# Patient Record
Sex: Male | Born: 1965
Health system: Southern US, Community
[De-identification: ages and names within clinical notes are randomized; demographics above are authoritative.]

## PROBLEM LIST (undated history)

## (undated) DIAGNOSIS — N200 Calculus of kidney: Secondary | ICD-10-CM

## (undated) DIAGNOSIS — K5792 Diverticulitis of intestine, part unspecified, without perforation or abscess without bleeding: Secondary | ICD-10-CM

## (undated) DIAGNOSIS — I10 Essential (primary) hypertension: Secondary | ICD-10-CM

## (undated) HISTORY — PX: ABDOMINAL SURGERY: SHX537

---

## 1989-06-10 HISTORY — PX: CARPAL TUNNEL RELEASE: SHX101

## 1991-06-11 HISTORY — PX: ORIF TIBIA & FIBULA FRACTURES: SHX2131

## 1991-06-11 HISTORY — PX: OTHER SURGICAL HISTORY: SHX169

## 2002-06-10 HISTORY — PX: COLOSTOMY: SHX63

## 2003-05-23 ENCOUNTER — Inpatient Hospital Stay (HOSPITAL_COMMUNITY): Admission: EM | Admit: 2003-05-23 | Discharge: 2003-06-03 | Payer: Self-pay | Admitting: Emergency Medicine

## 2003-05-27 ENCOUNTER — Encounter (INDEPENDENT_AMBULATORY_CARE_PROVIDER_SITE_OTHER): Payer: Self-pay | Admitting: Specialist

## 2003-06-11 HISTORY — PX: APPENDECTOMY: SHX54

## 2003-07-01 ENCOUNTER — Encounter: Admission: RE | Admit: 2003-07-01 | Discharge: 2003-07-01 | Payer: Self-pay | Admitting: General Surgery

## 2003-08-18 ENCOUNTER — Encounter: Admission: RE | Admit: 2003-08-18 | Discharge: 2003-08-18 | Payer: Self-pay | Admitting: General Surgery

## 2003-09-09 ENCOUNTER — Encounter: Admission: RE | Admit: 2003-09-09 | Discharge: 2003-09-20 | Payer: Self-pay | Admitting: General Surgery

## 2003-09-15 ENCOUNTER — Inpatient Hospital Stay (HOSPITAL_COMMUNITY): Admission: RE | Admit: 2003-09-15 | Discharge: 2003-09-20 | Payer: Self-pay | Admitting: General Surgery

## 2003-09-15 ENCOUNTER — Encounter (INDEPENDENT_AMBULATORY_CARE_PROVIDER_SITE_OTHER): Payer: Self-pay | Admitting: Specialist

## 2004-04-25 ENCOUNTER — Observation Stay (HOSPITAL_COMMUNITY): Admission: EM | Admit: 2004-04-25 | Discharge: 2004-04-25 | Payer: Self-pay | Admitting: Emergency Medicine

## 2004-06-10 HISTORY — PX: HERNIA REPAIR: SHX51

## 2004-07-06 IMAGING — CT CT ABDOMEN W/ CM
1 series · 15 of 32 positions shown, 19 images · IV contrast (GASTRO & OMNIPAQUE 150)
Comparison: none

CLINICAL DATA: Previous diverticulitis with sigmoid resection and colostomy placement.  Abdominal bloating.

[Series 2: routine abdomen · axial · 0.86mm/px · z∈[-360,+30]mm · 15 of 145 slices shown, 19 images]
[im 10/145  soft-tissue]
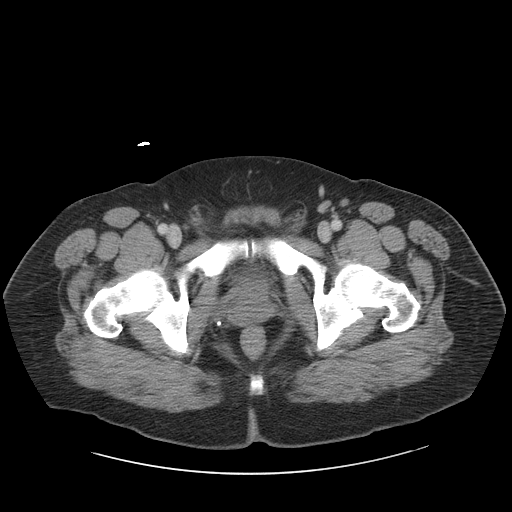
[im 10/145  bone]
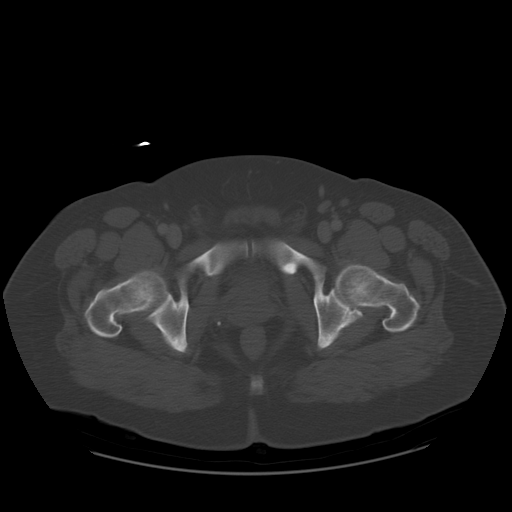
[im 19/145  soft-tissue]
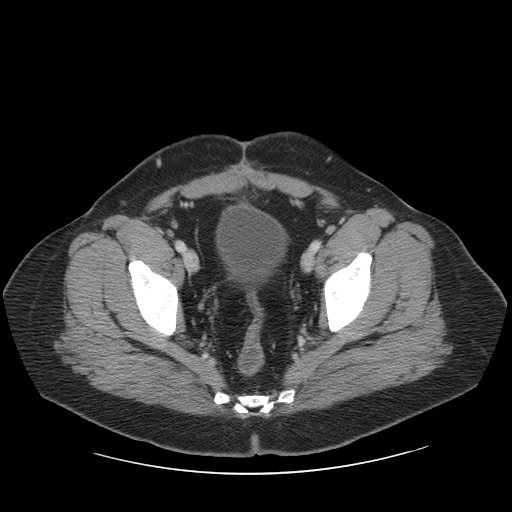
[im 28/145  soft-tissue]
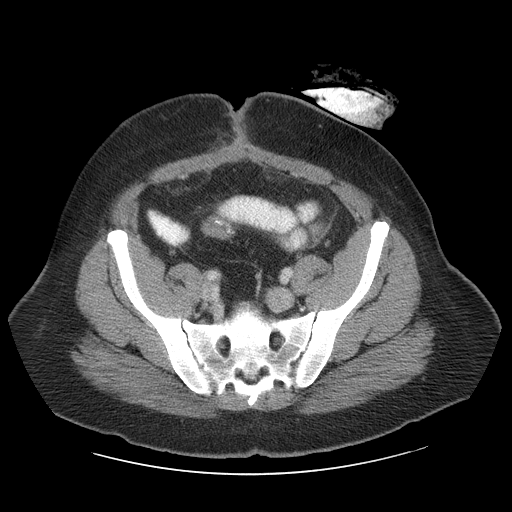
[im 42/145  soft-tissue]
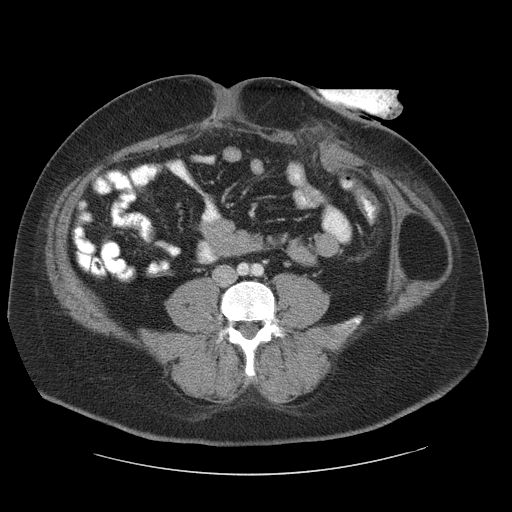
[im 52/145  soft-tissue]
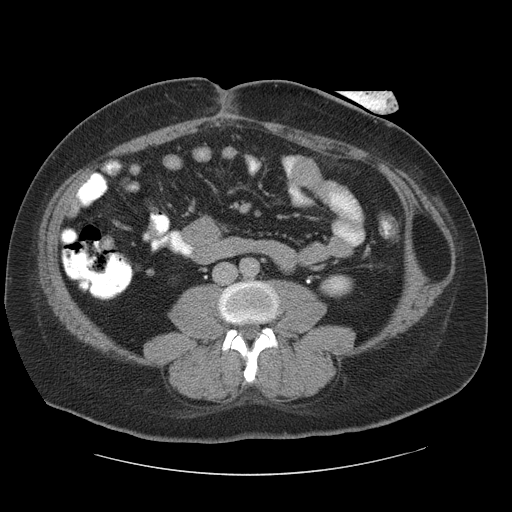
[im 61/145  soft-tissue]
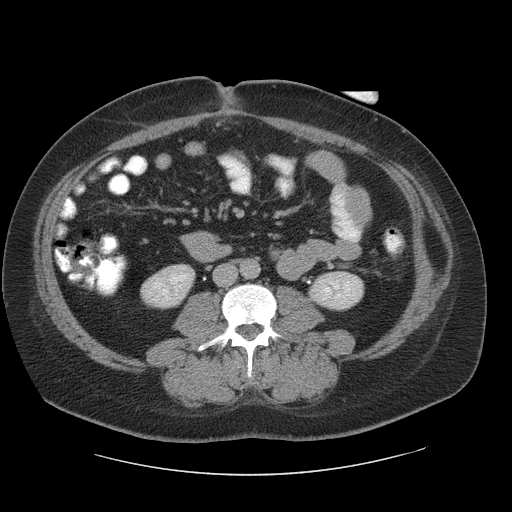
[im 75/145  soft-tissue]
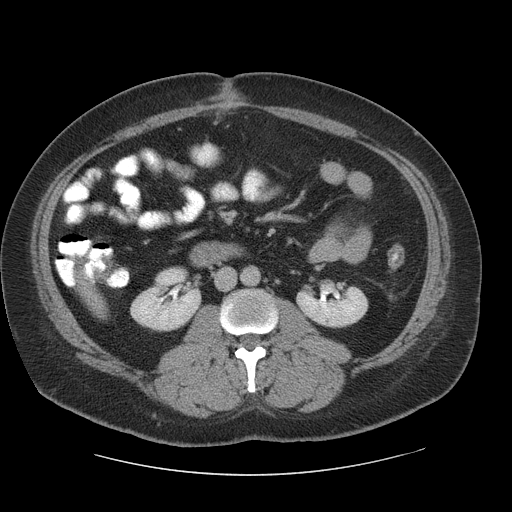
[im 84/145  soft-tissue]
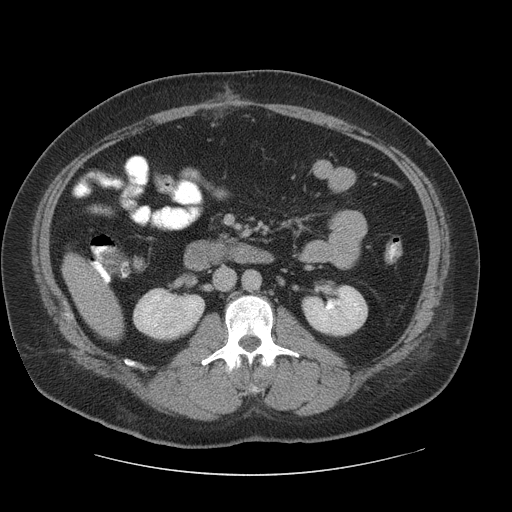
[im 93/145  soft-tissue]
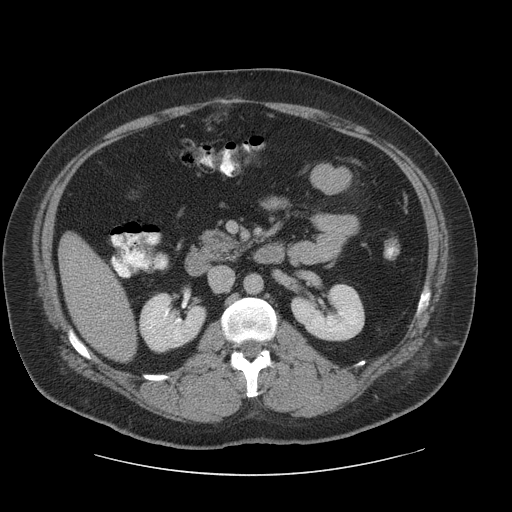
[im 93/145  bone]
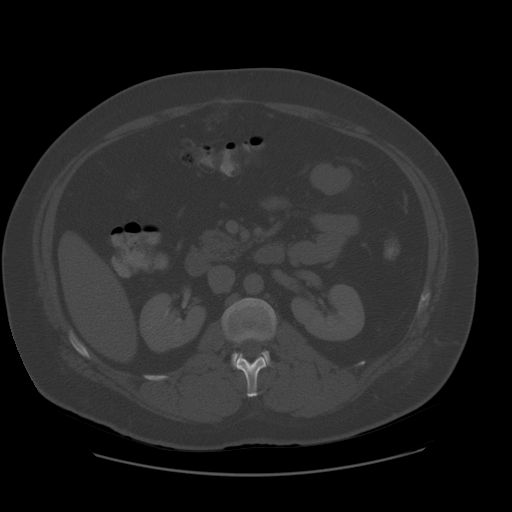
[im 103/145  soft-tissue]
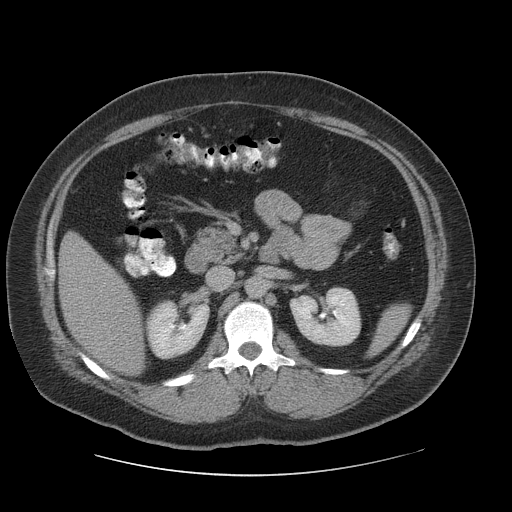
[im 117/145  soft-tissue]
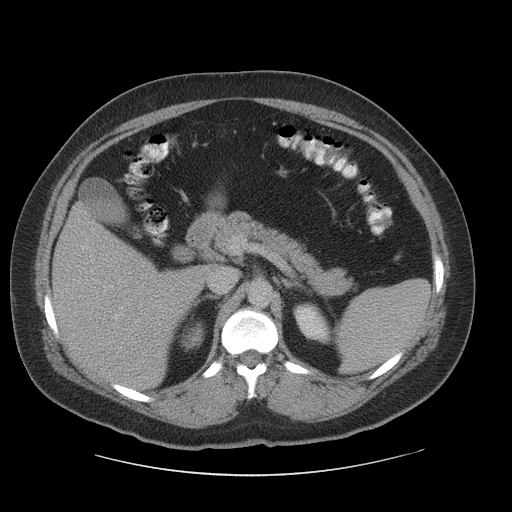
[im 126/145  soft-tissue]
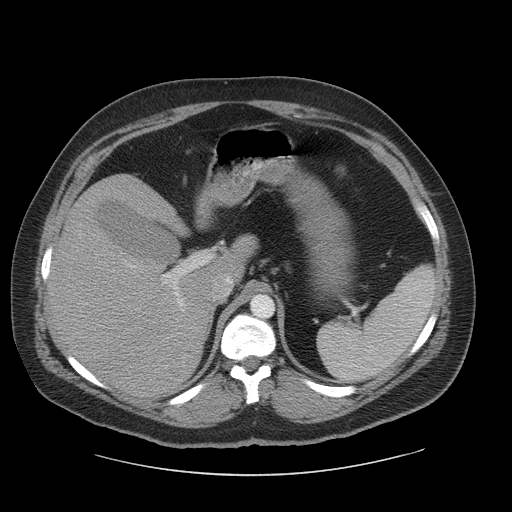
[im 126/145  lung]
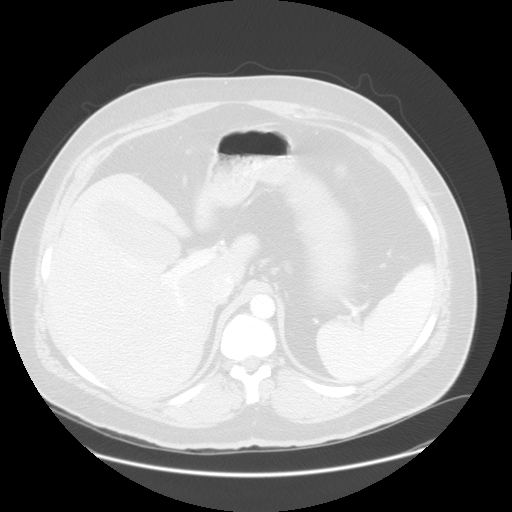
[im 131/145  lung]
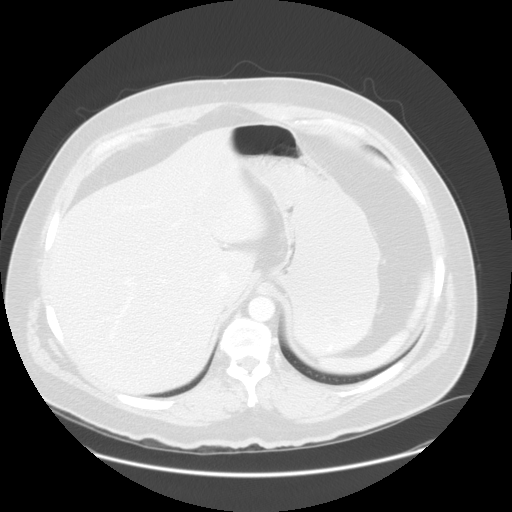
[im 135/145  soft-tissue]
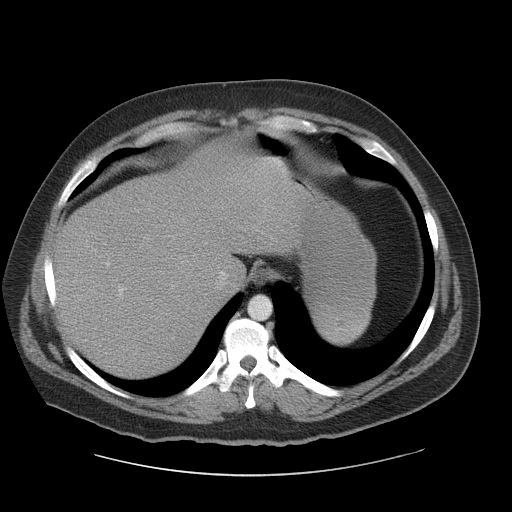
[im 135/145  lung]
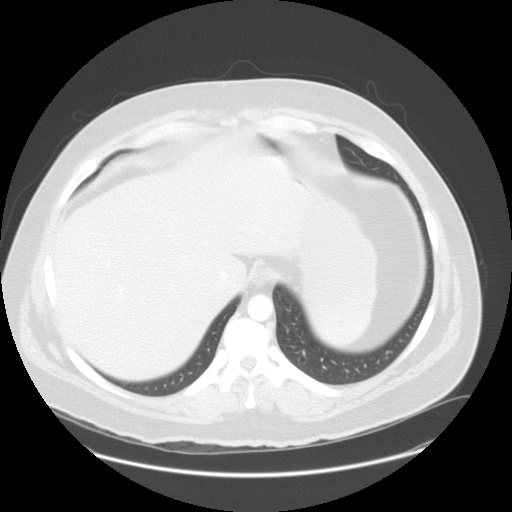
[im 140/145  lung]
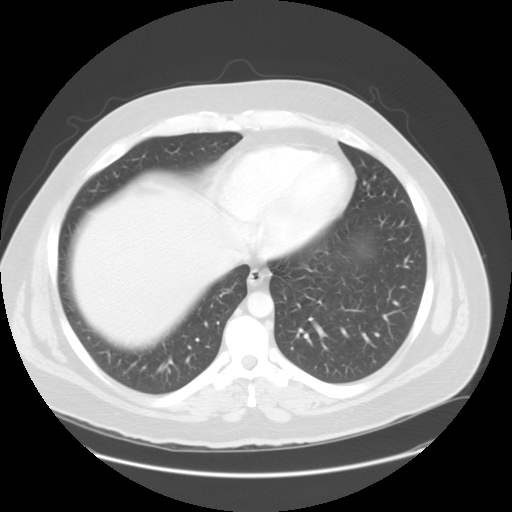

[15 of 32 positions shown; findings below may reference images not displayed]

CT OF THE ABDOMEN WITH IV CONTRAST
 Multidetector helical study performed during IV contrast enhancement with 150 cc of Omnipaque injected at 2.7 cc per second.  Comparison is made with the prior study from [HOSPITAL] dated 05/27/03.  Scans of the lung bases are normal.  The liver, spleen, pancreas, kidneys, and adrenal glands have a normal appearance.  There is no free peritoneal fluid or air.  

 IMPRESSION
 Negative CT of the abdomen.

 CT OF THE PELVIS WITH IV CONTRAST
 When compared to the previous study, the patient has undergone sigmoid resection with creation of colostomy in the left lower quadrant.  There is no pelvic abscess or free peritoneal fluid/air.  Mild residual inflammatory changes are seen within the fat surrounding the descending colon and there is slight residual bowel wall thickening associated with the distal descending colon.  Also, there is significant interval improvement in the previously seen small bowel distention.  Contrast is seen entering the colon and the colostomy bag.  There is an incidental lipoma seen within the lateral abdominal wall musculature on the left.  The bladder has a normal appearance. 

 IMPRESSION
 Interval sigmoid resection for diverticulitis with creation of colostomy.  No pelvic abscess.  Mild residual inflammatory changes seen within the pericolonic fat in the region of the distal descending colon.  

 Incidental lipoma associated with the lateral abdominal wall musculature. 

 [REDACTED]

## 2004-07-20 ENCOUNTER — Ambulatory Visit (HOSPITAL_COMMUNITY): Admission: RE | Admit: 2004-07-20 | Discharge: 2004-07-20 | Payer: Self-pay | Admitting: General Surgery

## 2004-08-17 ENCOUNTER — Inpatient Hospital Stay (HOSPITAL_COMMUNITY): Admission: RE | Admit: 2004-08-17 | Discharge: 2004-08-18 | Payer: Self-pay | Admitting: General Surgery

## 2006-06-25 ENCOUNTER — Encounter: Admission: RE | Admit: 2006-06-25 | Discharge: 2006-06-25 | Payer: Self-pay | Admitting: Internal Medicine

## 2006-06-29 ENCOUNTER — Emergency Department (HOSPITAL_COMMUNITY): Admission: EM | Admit: 2006-06-29 | Discharge: 2006-06-29 | Payer: Self-pay | Admitting: Emergency Medicine

## 2007-06-11 HISTORY — PX: ANKLE FRACTURE SURGERY: SHX122

## 2008-06-01 ENCOUNTER — Ambulatory Visit (HOSPITAL_COMMUNITY): Admission: RE | Admit: 2008-06-01 | Discharge: 2008-06-01 | Payer: Self-pay | Admitting: Orthopedic Surgery

## 2009-06-10 HISTORY — PX: KIDNEY STONE SURGERY: SHX686

## 2009-11-13 ENCOUNTER — Ambulatory Visit (HOSPITAL_COMMUNITY): Admission: RE | Admit: 2009-11-13 | Discharge: 2009-11-14 | Payer: Self-pay | Admitting: Urology

## 2009-11-24 ENCOUNTER — Ambulatory Visit (HOSPITAL_COMMUNITY): Admission: RE | Admit: 2009-11-24 | Discharge: 2009-11-24 | Payer: Self-pay | Admitting: Urology

## 2010-08-26 LAB — SURGICAL PCR SCREEN
MRSA, PCR: NEGATIVE
Staphylococcus aureus: NEGATIVE

## 2010-08-27 LAB — BASIC METABOLIC PANEL
BUN: 16 mg/dL (ref 6–23)
CO2: 28 mEq/L (ref 19–32)
Calcium: 9.6 mg/dL (ref 8.4–10.5)
Chloride: 105 mEq/L (ref 96–112)
Creatinine, Ser: 1.1 mg/dL (ref 0.4–1.5)
GFR calc Af Amer: >60 mL/min (ref >60)
GFR calc non Af Amer: >60 mL/min (ref >60)
Glucose, Bld: 94 mg/dL (ref 70–99)
Potassium: 4.3 mEq/L (ref 3.5–5.1)
Sodium: 140 mEq/L (ref 135–145)

## 2010-08-27 LAB — CBC
HCT: 43.9 % (ref 39.0–52.0)
Hemoglobin: 15.4 g/dL (ref 13.0–17.0)
MCHC: 35.1 g/dL (ref 30.0–36.0)
MCV: 92.5 fL (ref 78.0–100.0)
Platelets: 182 10*3/uL (ref 150–400)
RBC: 4.75 MIL/uL (ref 4.22–5.81)
RDW: 12.9 % (ref 11.5–15.5)
WBC: 6.3 10*3/uL (ref 4.0–10.5)

## 2010-08-27 LAB — HEMOGLOBIN AND HEMATOCRIT, BLOOD
HCT: 34.1 % — ABNORMAL LOW (ref 39.0–52.0)
HCT: 39.4 % (ref 39.0–52.0)
Hemoglobin: 12 g/dL — ABNORMAL LOW (ref 13.0–17.0)
Hemoglobin: 13.9 g/dL (ref 13.0–17.0)

## 2010-10-23 NOTE — Op Note (Signed)
NAME:  Christopher Oliver, Christopher Oliver                 ACCOUNT NO.:  [id number]   MEDICAL RECORD NO.:  [id number]          PATIENT TYPE:  AMB   LOCATION:  SDS                          FACILITY:  [facility]   PHYSICIAN:  Rodney A. Mortenson, M.D.DATE OF BIRTH:  May 24, 1966   DATE OF PROCEDURE:  06/01/2008  DATE OF DISCHARGE:                               OPERATIVE REPORT   PREOPERATIVE DIAGNOSIS:  Fracture dislocation right ankle oblique  fracture, right distal fibula.   POSTOPERATIVE DIAGNOSIS:  Fracture dislocation right ankle oblique  fracture, right distal fibula.   OPERATION:  Open reduction and internal fixation using an 8-hole side  plate on the fibula and a lasso through the lateral plate and tibia.   SURGEON:  Lenard Galloway. Chaney Malling, MD   ASSISTANT:  Oris Drone. Petrarca, PA-C   ANESTHESIA:  General.   PROCEDURE:  The patient was placed on the operating table in the supine  position with a pneumatic tourniquet above the right upper thigh.  The  right lower extremity was prepped with DuraPrep and draped out in the  usual manner.  The leg was then wrapped out with an Esmarch and  tourniquet was elevated.  An incision was made over the lateral side of  the fibula starting above the fracture and carried down to the tip of  the fibula.  Skin edges were retracted.  Self-retaining retractor was  put in place.  Care was taken to avoid injury to the superficial  cutaneous nerves.  The fracture could clearly be seen.  The talus was  reduced back in the mortise and a bone clamp was placed on the fracture  and held in a reduced position.  There appeared to be an anatomic  reduction.  X-rays were taken and good positioning of the fracture and  reduction of the talus in the ankle was achieved.  An 8-hole side plate  was selected and bent and preformed to fit the lateral contour of the  fibula.  This was held on the flue of the second clamp.  X-ray was taken  and good position was seen.  Drill holes were placed  distally and 2  cancellous screws were placed in the distal plate proximally and a cork  screw was placed across the fibula and further x-rays were taken.  Excellent positioning was achieved.  Further screws were then measured  and the appropriate length screws were inserted in a standard manner.  Just above the level of the distal tibial articular surface, drill hole  was placed through the fibula and through the tibia to the medial border  of the tibia using a guide wire plus a cannulated drill.  The lasso was  then selected and the pass needle was passed through the fibula across  the tibia to medial border of the tibia and brought out through the skin  through a puncture wound.  The lasso was then pulled through the fibular  hole, through the hole in the tibia and the button medially was rotated  90 degrees and traction was placed on the large FiberWire sutures and  good purchase medially  was achieved.  The button was flushed on the  tibia.  Pressure was placed on the fibula, closed down the syndesmosis,  and the large FiberWire sutures were pulled on quite tightly and knots  were tied.  Excellent reduction was achieved according to the x-rays.  The button was on the top of the lateral side plate, which had a  stability.  The excess suture was cut.  Wound was irrigated with copious  amounts saline solution.  Subcutaneous tissue was closed with Vicryl and  stainless steel staples were used to close the skin.  Sterile dressing  was applied.  The patient returned to the recovery room in excellent  condition.  Technically, an excellent reduction was achieved.   DRAINS:  None.   COMPLICATIONS:  None.   DISPOSITION:  1. Usual postop instructions.  2. Percocet for pain.  3. To my office on Monday and Wednesday to seen Jacqualine Code, PA-C.      Rodney A. Chaney Malling, M.D.  Electronically Signed     RAM/MEDQ  D:  06/01/2008  T:  06/02/2008  Job:  [id number]

## 2010-10-26 NOTE — Op Note (Signed)
NAME:  Christopher Oliver, Christopher Oliver                           ACCOUNT NO.:  [id number]   MEDICAL RECORD NO.:  [id number]                   PATIENT TYPE:  INP   LOCATION:  0463                                 FACILITY:  [facility]   PHYSICIAN:  Ollen Gross. Vernell Morgans, M.D.              DATE OF BIRTH:  [birth year]   DATE OF PROCEDURE:  09/15/2003  DATE OF DISCHARGE:                                 OPERATIVE REPORT   PREOPERATIVE DIAGNOSIS:  Prior colostomy for perforated diverticulitis.   POSTOPERATIVE DIAGNOSIS:  Prior colostomy for perforated diverticulitis.   OPERATION/PROCEDURE:  1. Exploratory laparotomy.  2. Lysis of adhesions.  3. Appendectomy.  4. Colostomy take-down.  5. Repair of ventral hernia.   SURGEON:  Ollen Gross. Carolynne Edouard, M.D.   ASSISTANT:  Abigail Miyamoto, M.D.   ANESTHESIA:  General endotracheal anesthesia.   DESCRIPTION OF PROCEDURE:  After informed consent was obtained, the patient  was brought to the operating room and placed in the supine position on the  operating room table.  After adequate induction of general anesthesia, the  patient was placed in the lithotomy position and his abdomen and perineum  were prepped with Betadine and draped in the usual sterile manner.   A midline incision was as made with a 10-blade knife.  His old midline scar  was also excised sharply with the 15-blade knife and the electrocautery.  The dissection was then carried through the subcutaneous tissue sharply with  the electrocautery until the midline of the anterior abdominal wall fascia  was encountered.  The prior 0 Prolene stitches were encountered during this  portion of the dissection.  These were removed sharply with heavy scissors.  The midline fascia was incised with the electrocautery and then blunt  dissection was then carried out below this area to free the adhesions from  the anterior abdominal wall.  There was significant omentum adherent to the  anterior abdominal wall.  This was taken down  by a combination of blunt  finger dissection and sharp dissection with the electrocautery and  Metzenbaum scissors.  Laterally free space was able to be identified and  once this was visible, the omentum was taken down off the anterior abdominal  wall sharply with the electrocautery, avoiding any of the bowel.  Once this  was finished, the omentum was able to be reflected superiorly.  There were  multiple adhesions between loops of small bowel and these were all taken  down sharply with the Metzenbaum scissors.  The small bowel was then able to  be packed into the upper abdomen.  The patient was placed in the  Trendelenburg position to aid with this.  A Balfour retractor was used to  retract the sidewalls of the abdomen laterally.   Initially attention was turned to the right lower quadrant.  The cecum and  appendix were identified.  The mesoappendix was taken down by serially  clamping, dividing  and ligating with Kelly clamps and 2-0 silk ties.  Once  the appendix was free at its space, the base of the appendix was clamped  with a straight hemostat.  Hemostat was then moved several millimeters  distal and reclamped.  An 0 chromic tie was placed across the base of the  appendix at a clamped portion.  The appendix was then divided just proximal  to the clamp sharply with the 10-blade knife.  The stump of the appendix was  then dunked using the 2-0 silk Z stitch.   Next, the stump of the rectum was identified.  Prior 2-0 Prolene stitches  had been used to mark the end of the rectal stump.  The rectal stump was  mobilized somewhat by incising the peritoneal reflection down in the pelvis  near the base of the mesentery of the rectum.  The distal couple centimeters  of the rectal stump was then mobilized by taking down the mesentery to the  small section of the rectum with Kelly clamps, dividing with Metzenbaum  scissors and ligating with 2-0 silk ties.  This dissection was carried to  the  edge of the bowel wall at the distal couple of centimeters of rectum.  An Allen clamp was then placed across the rectal wall at this point and the  distal segment was excised sharply with the 10-blade knife.  Rectal wall  appeared to be very healthy and had good blood supply.   Next, attention was then turned to the ostomy.  The ostomy was mobilized  initially from the inside sharply with the electrocautery, and once it had  been mobilized fairly well from the inside, attention was turned to the skin  level ostomy.  The ostomy was excised at the skin in an elliptical fashion  using a 10-blade knife.  The dissection was carried through the skin and  subcutaneous tissue sharply with the electrocautery until the colostomy was  mostly freed.  It was then able to be brought through the abdominal wall  inside the abdomen and the rest of the ostomy was mobilized sharply with the  electrocautery from the rest of the subcutaneous and muscular tissue.  It  was noted that the ostomy opening through the anterior abdominal wall had  enlarged somewhat and there seemed to be some element of hernia to this  which was taken down.  The left colon was then mobilized by incising its  retroperitoneal attachment along the [place] line of Toldt to give it some  mobilization.  The distal 5-6 cm of the proximal segment was then excised by  initially dividing the mesentery to this portion of the bowel by serially  clamping with the Kelly clamps, dividing, and ligating with 2-0 silk ties.  This dissection was carried to the colon wall.  The colon at this point of  division was then clamped with both an Allen clamp and a Kocher clamp and  divided between the two.  The distal segment was sent to pathology.  The  proximal segment was very healthy-appearing with good blood supply and  easily would reach the rectal stump.  An anastomosis between the proximal and distal segments was then accomplished using interrupted 3-0  silk full-  thickness stitches.  Initially the posterior wall was sewn in this manner  with the knots on the inside and then the anterior wall of the anastomosis  was completed in the similar fashion.  The last several stitches were sewn  in a simple fashion with  the knots on the outside.  Once this was  accomplished, the anastomosis appeared to be in good condition and widely  patent.   The abdomen was then irrigated with copious amounts of the saline and gloves  were changed.  The defect of the anterior abdominal wall at the old  colostomy site was then closed, initially with some 0 Prolene interrupted  stitches and then with a #1 Prolene running stitch from the inside.  The  defect was then closed in a second layer from the outside with interrupted 0  Prolene stitches.  The scar tissue from the midline of the anterior  abdominal wall was very thick at the fascial level and it was difficult to  identify the fascia within the scar.  Because of this, the subcutaneous  tissue just anterior to the fascia was undermined sharply with the  electrocautery to allow a good layer of fascia and scar to put back  together.  The anterior abdominal wall was then closed with three running #1  PDS sutures.  The upper portion of the wound was also reinforced with some 0  Prolene interrupted stitches.  Once the anterior abdominal wall was closed  and the subcutaneous tissue of the midline wound and the ostomy site  irrigated with a liter of saline mixed with several mL of Betadine, the skin  incisions were then all closed with staples.  The patient tolerated the  procedure well.  At the end of the case, all needle, sponge and instrument  counts were correct.  The patient was then awakened and taken to the  recovery room in stable condition.                                               Ollen Gross. Vernell Morgans, M.D.    PST/MEDQ  D:  09/15/2003  T:  09/16/2003  Job:  [id number]

## 2010-10-26 NOTE — Discharge Summary (Signed)
NAME:  Christopher Oliver, Christopher Oliver                           ACCOUNT NO.:  [id number]   MEDICAL RECORD NO.:  [id number]                   PATIENT TYPE:  INP   LOCATION:  0463                                 FACILITY:  [facility]   PHYSICIAN:  Ollen Gross. Vernell Morgans, M.D.              DATE OF BIRTH:  [birth year]   DATE OF ADMISSION:  09/15/2003  DATE OF DISCHARGE:  09/20/2003                                 DISCHARGE SUMMARY   HISTORY OF PRESENT ILLNESS:  Christopher Oliver is a 45 year old [place] male who had  had a prior colostomy for perforated sigmoid diverticulitis.   HOSPITAL COURSE:  He was brought to the operating room on September 15, 2003 for  colostomy take down.  He was also on the __________ study.  He tolerated the  operation well.  On postoperative day #1, he was offered some liquids per  protocol.  He did have some itching, probably secondary to his morphine.  He  was monitored closely, and ran some low-grade fevers over the next couple  days.  He did have some abdominal distention after surgery, but by September 20, 2003, he was passing flatus and began having bowel movements.  He had  tolerated his diet, and at that point was ready for discharge home.   MEDICATIONS AT THE TIME OF DISCHARGE:  1. Mepergan Fortis for pain.  2. Dramamine for nausea.   ACTIVITY:  No heavy lifting.   DIET:  As tolerated.   CONDITION:  Stable.   FINAL DIAGNOSIS:  Colostomy take down.   FOLLOW UP:  With Dr. Carolynne Edouard within the week for staple removal.   DISPOSITION:  He was discharged home.                                               Ollen Gross. Vernell Morgans, M.D.    PST/MEDQ  D:  10/05/2003  T:  10/05/2003  Job:  [id number]

## 2010-10-26 NOTE — Op Note (Signed)
NAME:  Christopher Oliver, Christopher Oliver                           ACCOUNT NO.:  [id number]   MEDICAL RECORD NO.:  [id number]                   PATIENT TYPE:  INP   LOCATION:  5526                                 FACILITY:  [facility]   PHYSICIAN:  Ollen Gross. Vernell Morgans, M.D.              DATE OF BIRTH:  Apr 21, 1966   DATE OF PROCEDURE:  05/27/2003  DATE OF DISCHARGE:  06/03/2003                                 OPERATIVE REPORT   PREOPERATIVE DIAGNOSIS:  Perforated sigmoid diverticulitis.   POSTOPERATIVE DIAGNOSIS:  Perforated sigmoid diverticulitis.   PROCEDURE:  Sigmoid colectomy with colostomy.   SURGEON:  Ollen Gross. Carolynne Edouard, M.D.   ASSISTANT:  __________.   ANESTHESIA:  General endotracheal.   DESCRIPTION OF PROCEDURE:  After informed consent was obtained, the patient  was brought to the operating room and placed in supine position on the  operating table.  After adequate induction of general anesthesia, the  patient's abdomen was prepped with Betadine and draped in the usual sterile  manner.  A midline incision was made with a 10 blade knife.  This incision  was carried down through the skin and subcutaneous tissue sharply with the  electrocautery until the linea alba was identified.  The linea alba was  incised with the electrocautery.  The preperitoneal space was then probed  bluntly with a hemostat until the peritoneum was identified.  The peritoneum  was opened with a pair of Metzenbaum scissors, gaining access to the  abdominal cavity.  The rest of the incision was then opened under direct  vision.  A Balfour retractor was used to retract the sidewalls of the  abdomen laterally.  There was a significant amount of infection identified.  This was cultured.  The attention was turned to the left lower quadrant.  The sigmoid colon was mobilized by incising its attachment to the  retroperitoneum along the Jaffer line of Toldt.  A site was chosen above the  area of inflammation of the colon for division of the  colon wall.  The  mesentery at this point was opened sharply with the electrocautery.  A GIA-  75 stapler was then placed across the bowel at this point, clamped and  fired, thereby dividing the bowel between staple lines.  The mesentery of  the sigmoid colon was then taken down by serially clamping with Kelly  clamps, dividing, and ligating with 2-0 silk ties each of the vessels within  the mesentery.  This dissection was kept within the mesentery to stay above  the ureter.  Once the dissection was carried down to a point below the area  of inflammation of the sigmoid colon, another site was chosen for division  of the rectosigmoid at an area where the bowel appeared to be healthy.  The  mesentery at this point was opened sharply with electrocautery.  A GIA-75  stapler was again placed across the bowel, at this point  clamped and fired,  thereby dividing the bowel between staple lines.  The rest of the mesentery  was taken down with Kelly clamps by clamping, dividing, and ligating the  rest of the vessels within the mesentery of the bowel that was affected.  Once this was accomplished the specimen was removed from the patient, sent  to pathology for further evaluation.  The abdomen was then irrigated with  copious amounts of saline.  The descending colon was then mobilized by  incising its retroperitoneal attachment along the [place] line of Toldt to a  point near the splenic flexure.  This allowed enough mobilization of the  descending colon to be able to bring it out as a diverting colostomy.  A  site was then chosen on the abdominal wall for the colostomy site and a  circular area of skin and subcutaneous fat was removed sharply with the 10  blade knife and electrocautery.  The rectus fascia was opened in a cruciate  manner to allow three fingers to be inserted easily.  The descending colon  was then brought up through this opening to the skin.  Because of the  concern for the viability of  the colostomy, this was opened first.  The  mucosa was somewhat dusky-appearing, and it appeared as though it was being  pinched as it came through the abdominal wall.  The cruciate opening of the  rectus fascia was then opened more, which took most of the tension away from  the colostomy.  The colostomy was then sewn circumferentially with  interrupted 3-0 Vicryls to the skin to mature the colostomy.  Again the rest  of the abdomen was then irrigated with copious amounts of saline.  The  fascia of the anterior abdominal wall was closed with two running #1 PDS  sutures.  The subcutaneous tissue was irrigated with copious amounts of  saline, and the skin was closed with staples.  Sterile dressings and a  colostomy appliance were applied.  The patient tolerated the procedure well.  Prior to closing the abdomen, also a 43 [place] round Blake drain was brought  into the right lower quadrant and placed down in the pelvis and the rectal  stump was marked with a 2-0 Prolene stitch.  The patient tolerated the  procedure well.  At the end of the case all needle, sponge, and instrument  counts were correct.  The patient was then awakened and taken to the  recovery room in stable condition.                                               Ollen Gross. Vernell Morgans, M.D.    PST/MEDQ  D:  06/06/2003  T:  06/06/2003  Job:  [id number]

## 2010-10-26 NOTE — Op Note (Signed)
NAME:  Christopher Oliver, Christopher Oliver                 ACCOUNT NO.:  [id number]   MEDICAL RECORD NO.:  [id number]          PATIENT TYPE:  INP   LOCATION:  0457                         FACILITY:  [facility]   PHYSICIAN:  Ollen Gross. Vernell Morgans, M.D. DATE OF BIRTH:  [birth year]   DATE OF PROCEDURE:  08/16/2004  DATE OF DISCHARGE:  08/18/2004                                 OPERATIVE REPORT   PREOPERATIVE DIAGNOSES:  Incisional ventral hernia.   POSTOPERATIVE DIAGNOSES:  Incisional ventral hernia.   PROCEDURE:  Laparoscopic ventral hernia repair with mesh and extensive lysis  of adhesions.   SURGEON:  Ollen Gross. Carolynne Edouard, M.D.   ASSISTANT:  Gabrielle Dare. Janee Morn, M.D.   ANESTHESIA:  General endotracheal.   DESCRIPTION OF PROCEDURE:  After informed consent was obtained, the patient  was brought to the operating room, placed in a supine position on the  operating room table.  After adequate induction of general anesthesia, the  patient's abdomen was prepped with Betadine, draped in the usual sterile  manner including an Ioban in the right lateral abdomen. A small incision was  made with a 15 blade knife. A camera and Optiview type port were used to  dissect gently and bluntly through the layers of the abdominal wall until  the abdominal cavity was entered.  The abdomen was then insufflated with  carbon dioxide without difficulty. The laparoscope was then placed through  the Optiview 10 mm port and the abdomen was inspected. There were a  significant number of adhesions of what appeared to be just omentum to the  anterior abdominal wall. A site was chosen in the right upper quadrant for  placement of a 5 mm port, this area was infiltrated with 0.25% Marcaine, a  small incision was made with a 15 blade knife and a 5 mm port was placed  bluntly through this incision into the abdominal cavity under direct vision.  Using the harmonic scalpel and blunt dissection, the adhesions were  gradually taken down. Some of these adhesions  were also taken down sharply  with the laparoscopic scissors. This careful dissection of these adhesions  took over two hours to take down safely.  Once all of these adhesions had  been taken down from the anterior abdominal wall, it did appear that there  were several Swiss cheese type defects to the anterior abdominal wall  incision and two other smaller defects at the old ostomy site.  The size of  the mesh that would be needed was estimated using a spinal needle allowing  for some overlap of the mesh onto healthy tissue and the size of the mesh  chosen was approximately 20 x 20 cm. A piece of Parietex mesh was chosen for  this and it was cut to fit. The mesh was oriented with letters and #1  novofil stitches were placed on the mesh in 8 places concentrically around  the circle edge of the mesh.  The mesh was then dampened with some saline,  rolled like a cigar and placed through the 10 mm port into the abdomen. At  this point, another  5 mm port was placed on the left abdomen in a similar  manner by infiltrating the area with 0.25% Marcaine making a small stab  incision with a 15 blade knife and placing the 5 mm port bluntly through  this incision under direct vision into the abdominal cavity. The mesh was  then unrolled and reoriented to correspond to the orientation on the  abdomen. Eight stab incisions were made around the outer aspect of the  estimated size of the mesh that corresponded to the placement of the novofil  stitches. A suture passing device was then placed through each of these  incisions in order to under direct vision grab the tails of the  corresponding novofil stitch and bring those back through the abdominal  wall. Once this had been done for all eight positions, the novofil stitches  were tightened up and the mesh appeared to approximate the anterior  abdominal wall very nicely and cover the defects completely. Each of these  stitches was then tied down and cut.   Next, a protacking device was used to  place tacks circumferentially around the edge of the mesh into the abdominal  wall to make sure there were no gaps and to make sure the mesh nicely  approximated the anterior abdominal wall.  Once this was accomplished, the  mesh appeared to be in good position covering all of the abdominal wall  defects. The abdomen was inspected again and everything appeared to be  hemostatic. At this point, the ports were removed under direct vision and  the gas was allowed to escape. The mesh was visualized as the gas was  leaving to continue to nicely approximate the anterior abdominal wall. All  of these skin incisions were then closed with interrupted 4-0 Monocryl  subcuticular stitches. Benzoin, Steri-Strips, sterile dressings and  abdominal binder were then applied to the abdomen.  The patient tolerated  the procedure well. At the end of the case, all sponge, needle and  instrument counts were correct.  The patient was then awakened and taken to  the recovery room in stable condition.      PST/MEDQ  D:  08/19/2004  T:  08/20/2004  Job:  [id number]

## 2010-10-26 NOTE — Discharge Summary (Signed)
NAME:  Deguire, JADRIEN NARINE                           ACCOUNT NO.:  [id number]   MEDICAL RECORD NO.:  [id number]                   PATIENT TYPE:  INP   LOCATION:  5526                                 FACILITY:  [facility]   PHYSICIAN:  Ollen Gross. Vernell Morgans, M.D.              DATE OF BIRTH:  1966/03/22   DATE OF ADMISSION:  05/23/2003  DATE OF DISCHARGE:  06/02/2003                                 DISCHARGE SUMMARY   HOSPITAL COURSE:  Mr. Welles is a 45 year old [place] male who was initially  admitted with sigmoid diverticulitis with some evidence of small contained  perforation.  He was started on broad-spectrum antibiotics and close  monitoring.  He initially did well but then failed to improve and continued  to have pain and fevers.  He then underwent repeat CT on May 26, 2004  that showed more evidence of perforation and abscess.  He was then taken to  the operating room on the 17th and underwent a sigmoid colectomy and  colostomy.  His postoperative course was complicated by some mild ileus that  persisted for several days but gradually improved.  He was maintained on  Cipro and Flagyl postoperatively.  On May 31, 2003, his ileus began to  improve and his nasogastric tube was able to be discontinued.  He slowly  advanced and improved until on the 23rd, he was ready for discharge home.   MEDICATIONS:  His medications at time of discharge include Cipro, Flagyl and  Vicodin for pain.   DIET:  His diet is a low-residue diet.   CONDITION ON DISCHARGE:  Condition is stable.   FINAL DIAGNOSIS:  Perforated sigmoid diverticulitis.   WOUND CARE:  He is to get wound dressing changes and ostomy care at home by  the home health nurses.   FOLLOWUP:  He will follow up in 1 week with Dr. Ollen Gross. Carolynne Edouard.   DISPOSITION:  He is discharged home.                                                Ollen Gross. Vernell Morgans, M.D.    PST/MEDQ  D:  06/27/2003  T:  06/27/2003  Job:  [id number]
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
## 2011-03-15 LAB — BASIC METABOLIC PANEL
Calcium: 9.4 mg/dL (ref 8.4–10.5)
Creatinine, Ser: 1.12 mg/dL (ref 0.4–1.5)
GFR calc Af Amer: >60 mL/min (ref >60)
GFR calc non Af Amer: >60 mL/min (ref >60)
Sodium: 138 mEq/L (ref 135–145)

## 2011-03-15 LAB — CBC
Hemoglobin: 16.2 g/dL (ref 13.0–17.0)
MCHC: 34.3 g/dL (ref 30.0–36.0)
RBC: 5.17 MIL/uL (ref 4.22–5.81)

## 2015-01-15 ENCOUNTER — Emergency Department (HOSPITAL_COMMUNITY)
Admission: EM | Admit: 2015-01-15 | Discharge: 2015-01-15 | Disposition: A | Discharge status: Home / Self Care | Payer: BLUE CROSS/BLUE SHIELD | Attending: Emergency Medicine | Admitting: Emergency Medicine

## 2015-01-15 ENCOUNTER — Emergency Department (HOSPITAL_COMMUNITY): Payer: BLUE CROSS/BLUE SHIELD

## 2015-01-15 ENCOUNTER — Encounter (HOSPITAL_COMMUNITY): Payer: Self-pay

## 2015-01-15 DIAGNOSIS — N201 Calculus of ureter: Secondary | ICD-10-CM | POA: Insufficient documentation

## 2015-01-15 DIAGNOSIS — Z8719 Personal history of other diseases of the digestive system: Secondary | ICD-10-CM | POA: Insufficient documentation

## 2015-01-15 DIAGNOSIS — Z880 Allergy status to penicillin: Secondary | ICD-10-CM | POA: Insufficient documentation

## 2015-01-15 DIAGNOSIS — Z79899 Other long term (current) drug therapy: Secondary | ICD-10-CM | POA: Diagnosis not present

## 2015-01-15 DIAGNOSIS — I10 Essential (primary) hypertension: Secondary | ICD-10-CM | POA: Insufficient documentation

## 2015-01-15 DIAGNOSIS — Z9889 Other specified postprocedural states: Secondary | ICD-10-CM | POA: Insufficient documentation

## 2015-01-15 DIAGNOSIS — R109 Unspecified abdominal pain: Secondary | ICD-10-CM

## 2015-01-15 DIAGNOSIS — R1030 Lower abdominal pain, unspecified: Secondary | ICD-10-CM | POA: Diagnosis present

## 2015-01-15 HISTORY — DX: Essential (primary) hypertension: I10

## 2015-01-15 HISTORY — DX: Calculus of kidney: N20.0

## 2015-01-15 HISTORY — DX: Diverticulitis of intestine, part unspecified, without perforation or abscess without bleeding: K57.92

## 2015-01-15 LAB — URINALYSIS, ROUTINE W REFLEX MICROSCOPIC
BILIRUBIN URINE: NEGATIVE
GLUCOSE, UA: NEGATIVE mg/dL
KETONES UR: NEGATIVE mg/dL
Leukocytes, UA: NEGATIVE
NITRITE: NEGATIVE
Protein, ur: NEGATIVE mg/dL
SPECIFIC GRAVITY, URINE: 1.017 (ref 1.005–1.030)
UROBILINOGEN UA: 0.2 mg/dL (ref 0.0–1.0)
pH: 6 (ref 5.0–8.0)

## 2015-01-15 LAB — BASIC METABOLIC PANEL
ANION GAP: 7 (ref 5–15)
BUN: 19 mg/dL (ref 6–20)
CO2: 25 mmol/L (ref 22–32)
CREATININE: 1.47 mg/dL — AB (ref 0.61–1.24)
Calcium: 8.8 mg/dL — ABNORMAL LOW (ref 8.9–10.3)
Chloride: 105 mmol/L (ref 101–111)
GFR calc non Af Amer: 55 mL/min — ABNORMAL LOW (ref >60)
Glucose, Bld: 143 mg/dL — ABNORMAL HIGH (ref 65–99)
POTASSIUM: 4 mmol/L (ref 3.5–5.1)
Sodium: 137 mmol/L (ref 135–145)

## 2015-01-15 LAB — CBC WITH DIFFERENTIAL/PLATELET
BASOS PCT: 0 % (ref 0–1)
Basophils Absolute: 0 10*3/uL (ref 0.0–0.1)
EOS ABS: 0 10*3/uL (ref 0.0–0.7)
Eosinophils Relative: 0 % (ref 0–5)
HCT: 51.3 % (ref 39.0–52.0)
Hemoglobin: 17.8 g/dL — ABNORMAL HIGH (ref 13.0–17.0)
Lymphocytes Relative: 8 % — ABNORMAL LOW (ref 12–46)
Lymphs Abs: 1 10*3/uL (ref 0.7–4.0)
MCH: 32.9 pg (ref 26.0–34.0)
MCHC: 34.7 g/dL (ref 30.0–36.0)
MCV: 94.8 fL (ref 78.0–100.0)
MONOS PCT: 6 % (ref 3–12)
Monocytes Absolute: 0.8 10*3/uL (ref 0.1–1.0)
NEUTROS PCT: 86 % — AB (ref 43–77)
Neutro Abs: 10.4 10*3/uL — ABNORMAL HIGH (ref 1.7–7.7)
PLATELETS: 181 10*3/uL (ref 150–400)
RBC: 5.41 MIL/uL (ref 4.22–5.81)
RDW: 13.1 % (ref 11.5–15.5)
WBC: 12.7 10*3/uL — ABNORMAL HIGH (ref 4.0–10.5)

## 2015-01-15 LAB — URINE MICROSCOPIC-ADD ON

## 2015-01-15 MED ORDER — KETOROLAC TROMETHAMINE 30 MG/ML IJ SOLN
30.0000 mg | Freq: Once | INTRAMUSCULAR | Status: DC
  Filled 2015-01-15: qty 1

## 2015-01-15 MED ORDER — HYDROMORPHONE HCL 2 MG/ML IJ SOLN
2.0000 mg | Freq: Once | INTRAMUSCULAR | Status: AC
  Administered 2015-01-15: 2 mg via INTRAMUSCULAR
  Filled 2015-01-15: qty 1

## 2015-01-15 MED ORDER — ONDANSETRON 4 MG PO TBDP: 4.0000 mg | ORAL_TABLET | Freq: Three times a day (TID) | ORAL | Status: DC | PRN

## 2015-01-15 MED ORDER — SODIUM CHLORIDE 0.9 % IV SOLN: INTRAVENOUS | Status: DC

## 2015-01-15 MED ORDER — SODIUM CHLORIDE 0.9 % IV BOLUS (SEPSIS): 1000.0000 mL | Freq: Once | INTRAVENOUS | Status: DC

## 2015-01-15 MED ORDER — ONDANSETRON 4 MG PO TBDP
4.0000 mg | ORAL_TABLET | Freq: Once | ORAL | Status: AC
  Administered 2015-01-15: 4 mg via ORAL
  Filled 2015-01-15: qty 1

## 2015-01-15 MED ORDER — ONDANSETRON 4 MG PO TBDP
4.0000 mg | ORAL_TABLET | Freq: Once | ORAL | Status: DC
  Filled 2015-01-15: qty 1

## 2015-01-15 MED ORDER — ONDANSETRON HCL 4 MG/2ML IJ SOLN
4.0000 mg | Freq: Once | INTRAMUSCULAR | Status: DC
  Filled 2015-01-15: qty 2

## 2015-01-15 MED ORDER — HYDROMORPHONE HCL 1 MG/ML IJ SOLN
1.0000 mg | Freq: Once | INTRAMUSCULAR | Status: DC
  Filled 2015-01-15: qty 1

## 2015-01-15 MED ORDER — HYDROMORPHONE HCL 2 MG PO TABS: 2.0000 mg | ORAL_TABLET | ORAL | Status: DC | PRN

## 2015-01-15 NOTE — Discharge Instructions (Signed)
Take the Zofran as needed for nausea and vomiting. Take the hydromorphone as needed for pain. Follow-up with your urologist tomorrow: That set up an appointment. Return for any new or worse symptoms.

## 2015-01-15 NOTE — ED Notes (Signed)
Pt c/o R flank pain radiating into R groin x 1 day.  Pain score 9/10.  Pt reports taking prescribed medication w/o relief.  Hx of kidney stones.  Pt is followed by [facility].

## 2015-01-15 NOTE — ED Provider Notes (Signed)
CSN: [id number]     Arrival date & time 01/15/15  0944 History   First MD Initiated Contact with Patient 01/15/15 1001     Chief Complaint  Patient presents with  . Flank Pain  . Groin Pain     (Consider location/radiation/quality/duration/timing/severity/associated sxs/prior Treatment) Patient is a 49 y.o. male presenting with flank pain and groin pain. The history is provided by the patient.  Flank Pain Associated symptoms include abdominal pain. Pertinent negatives include no chest pain, no headaches and no shortness of breath.  Groin Pain Associated symptoms include abdominal pain. Pertinent negatives include no chest pain, no headaches and no shortness of breath.   patient with history of kidney stones. Patient last seen by urology a few months ago. Sudden onset of right flank pain radiating to the back and right lower quadrant at 7:00 last evening. Associated with nausea and vomiting. Reminds patient of past kidney stone pain. Pain is currently 8 out of 10. Denies any hematuria. Denies fevers.  Past Medical History  Diagnosis Date  . Kidney stone   . Hypertension   . Diverticulitis    Past Surgical History  Procedure Laterality Date  . Abdominal surgery    . Ankle fracture surgery    . Orif tibia & fibula fractures     No family history on file. History  Substance Use Topics  . Smoking status: Never Smoker   . Smokeless tobacco: Not on file  . Alcohol Use: Yes     Comment: occ    Review of Systems  Constitutional: Negative for fever.  HENT: Negative for congestion.   Eyes: Negative for visual disturbance.  Respiratory: Negative for shortness of breath.   Cardiovascular: Negative for chest pain.  Gastrointestinal: Positive for nausea, vomiting and abdominal pain.  Genitourinary: Positive for flank pain. Negative for dysuria and hematuria.  Musculoskeletal: Positive for back pain.  Skin: Negative for rash.  Neurological: Negative for headaches.  Hematological:  Does not bruise/bleed easily.  Psychiatric/Behavioral: Negative for confusion.      Allergies  Penicillins  Home Medications   Prior to Admission medications   Medication Sig Start Date End Date Taking? Authorizing Provider  allopurinol (ZYLOPRIM) 300 MG tablet Take 300 mg by mouth daily.   Yes Historical Provider, MD  HYDROcodone-acetaminophen (NORCO/VICODIN) 5-325 MG per tablet Take 1 tablet by mouth every 4 (four) hours as needed for moderate pain or severe pain. Every 4-6 hours   Yes Historical Provider, MD  HYDROmorphone (DILAUDID) 2 MG tablet Take 2-4 mg by mouth every 4 (four) hours as needed for moderate pain or severe pain.   Yes Historical Provider, MD  lisinopril (PRINIVIL,ZESTRIL) 20 MG tablet Take 20 mg by mouth daily.   Yes Historical Provider, MD  naproxen sodium (ANAPROX) 220 MG tablet Take 220 mg by mouth 2 (two) times daily as needed (pain).   Yes Historical Provider, MD  simvastatin (ZOCOR) 20 MG tablet Take 20 mg by mouth every evening.   Yes Historical Provider, MD  HYDROmorphone (DILAUDID) 2 MG tablet Take 1 tablet (2 mg total) by mouth every 4 (four) hours as needed for severe pain. 01/15/15   Vanetta Mulders, MD  ondansetron (ZOFRAN ODT) 4 MG disintegrating tablet Take 1 tablet (4 mg total) by mouth every 8 (eight) hours as needed for nausea or vomiting. 01/15/15   Vanetta Mulders, MD   BP 133/80 mmHg  Pulse 83  Temp(Src) 98.7 F (37.1 C) (Oral)  Resp 18  SpO2 97% Physical Exam  Constitutional:  He is oriented to person, place, and time. He appears well-developed and well-nourished. No distress.  HENT:  Head: Normocephalic and atraumatic.  Mouth/Throat: Oropharynx is clear and moist.  Eyes: Conjunctivae and EOM are normal. Pupils are equal, round, and reactive to light.  Neck: Normal range of motion. Neck supple.  Cardiovascular: Normal rate, regular rhythm and normal heart sounds.   No murmur heard. Pulmonary/Chest: Effort normal and breath sounds normal. No  respiratory distress.  Abdominal: Soft. Bowel sounds are normal. There is no tenderness.  Musculoskeletal: Normal range of motion.  Neurological: He is alert and oriented to person, place, and time. No cranial nerve deficit. He exhibits normal muscle tone. Coordination normal.  Skin: Skin is warm.  Nursing note and vitals reviewed.   ED Course  Procedures (including critical care time) Labs Review Labs Reviewed  CBC WITH DIFFERENTIAL/PLATELET - Abnormal; Notable for the following:    WBC 12.7 (*)    Hemoglobin 17.8 (*)    Neutrophils Relative % 86 (*)    Neutro Abs 10.4 (*)    Lymphocytes Relative 8 (*)    All other components within normal limits  BASIC METABOLIC PANEL - Abnormal; Notable for the following:    Glucose, Bld 143 (*)    Creatinine, Ser 1.47 (*)    Calcium 8.8 (*)    GFR calc non Af Amer 55 (*)    All other components within normal limits  URINALYSIS, ROUTINE W REFLEX MICROSCOPIC (NOT AT [facility])   Results for orders placed or performed during the hospital encounter of 01/15/15  CBC with Differential/Platelet  Result Value Ref Range   WBC 12.7 (H) 4.0 - 10.5 K/uL   RBC 5.41 4.22 - 5.81 MIL/uL   Hemoglobin 17.8 (H) 13.0 - 17.0 g/dL   HCT 40.9 81.1 - 91.4 %   MCV 94.8 78.0 - 100.0 fL   MCH 32.9 26.0 - 34.0 pg   MCHC 34.7 30.0 - 36.0 g/dL   RDW 78.2 95.6 - 21.3 %   Platelets 181 150 - 400 K/uL   Neutrophils Relative % 86 (H) 43 - 77 %   Neutro Abs 10.4 (H) 1.7 - 7.7 K/uL   Lymphocytes Relative 8 (L) 12 - 46 %   Lymphs Abs 1.0 0.7 - 4.0 K/uL   Monocytes Relative 6 3 - 12 %   Monocytes Absolute 0.8 0.1 - 1.0 K/uL   Eosinophils Relative 0 0 - 5 %   Eosinophils Absolute 0.0 0.0 - 0.7 K/uL   Basophils Relative 0 0 - 1 %   Basophils Absolute 0.0 0.0 - 0.1 K/uL  Basic metabolic panel  Result Value Ref Range   Sodium 137 135 - 145 mmol/L   Potassium 4.0 3.5 - 5.1 mmol/L   Chloride 105 101 - 111 mmol/L   CO2 25 22 - 32 mmol/L   Glucose, Bld 143 (H) 65 - 99 mg/dL    BUN 19 6 - 20 mg/dL   Creatinine, Ser 0.86 (H) 0.61 - 1.24 mg/dL   Calcium 8.8 (L) 8.9 - 10.3 mg/dL   GFR calc non Af Amer 55 (L) >60 mL/min   GFR calc Af Amer >60 >60 mL/min   Anion gap 7 5 - 15     Imaging Review Ct Renal Stone Study  01/15/2015   CLINICAL DATA:  Pt c/o R flank pain radiating into R groin x 1 day. Prior history of kidney stones.  EXAM: CT ABDOMEN AND PELVIS WITHOUT CONTRAST  TECHNIQUE: Multidetector CT imaging of  the abdomen and pelvis was performed following the standard protocol without IV contrast.  COMPARISON:  02/04/2014  FINDINGS: There is mild to moderate right hydronephrosis and significant right perinephric stranding. The right ureter is mildly dilated. This is due to a 3.5 mm stone within the right ureterovesicular junction. No other ureteral stones. No left hydronephrosis. No intrarenal stones. No renal masses. Bladder is otherwise unremarkable.  Lung bases: Clear.  Heart normal size.  Liver:  Diffuse fatty infiltration.  No mass or focal lesion.  Spleen, gallbladder, pancreas, adrenal glands:  Normal.  Lymph nodes:  No wall enlarged lymph nodes.  Ascites:  None.  Gastrointestinal:  Unremarkable.  No appendix visualized.  Abdominal wall: Anterior abdominal wall hernia mesh. No recurrent hernia. Lipoma noted along the left flank abdominal wall musculature, stable.  Musculoskeletal: Mild degenerative changes most evident of the lower thoracic spine.  IMPRESSION: 1. 3.5 mm stone in the right ureterovesicular junction causing mild to moderate right hydronephrosis and significant right perinephric stranding. 2. No other acute findings. 3. No intrarenal stones. 4. Diffuse hepatic steatosis.   Electronically Signed   By: Amie Portland M.D.   On: 01/15/2015 10:48     EKG Interpretation None      MDM   Final diagnoses:  Flank pain  Right ureteral stone     Patient with improved pain. CT scan confirms right-sided ureteral kidney stone. Patient is already followed by  urology. Patient showing some evidence of some renal insufficiency. He will call tomorrow for follow-up. Patient otherwise stable.    Vanetta Mulders, MD 01/15/15 1247

## 2015-01-15 NOTE — ED Notes (Signed)
Patient unable to void at this time

## 2015-01-15 NOTE — ED Notes (Signed)
Lab called to state that the BMP (lt green) needs to be recollected.

## 2015-11-22 DIAGNOSIS — J029 Acute pharyngitis, unspecified: Secondary | ICD-10-CM | POA: Diagnosis not present

## 2015-11-22 DIAGNOSIS — R51 Headache: Secondary | ICD-10-CM | POA: Diagnosis not present

## 2016-02-29 DIAGNOSIS — E119 Type 2 diabetes mellitus without complications: Secondary | ICD-10-CM | POA: Diagnosis not present

## 2016-02-29 DIAGNOSIS — R634 Abnormal weight loss: Secondary | ICD-10-CM | POA: Diagnosis not present

## 2016-02-29 DIAGNOSIS — R350 Frequency of micturition: Secondary | ICD-10-CM | POA: Diagnosis not present

## 2016-02-29 DIAGNOSIS — R739 Hyperglycemia, unspecified: Secondary | ICD-10-CM | POA: Diagnosis not present

## 2016-04-10 DIAGNOSIS — N200 Calculus of kidney: Secondary | ICD-10-CM | POA: Diagnosis not present

## 2016-04-10 DIAGNOSIS — E782 Mixed hyperlipidemia: Secondary | ICD-10-CM | POA: Diagnosis not present

## 2016-04-10 DIAGNOSIS — I10 Essential (primary) hypertension: Secondary | ICD-10-CM | POA: Diagnosis not present

## 2016-04-10 DIAGNOSIS — E119 Type 2 diabetes mellitus without complications: Secondary | ICD-10-CM | POA: Diagnosis not present

## 2016-05-09 ENCOUNTER — Ambulatory Visit: Payer: BLUE CROSS/BLUE SHIELD | Admitting: Nutrition

## 2016-08-15 DIAGNOSIS — I10 Essential (primary) hypertension: Secondary | ICD-10-CM | POA: Diagnosis not present

## 2016-08-15 DIAGNOSIS — E782 Mixed hyperlipidemia: Secondary | ICD-10-CM | POA: Diagnosis not present

## 2016-08-15 DIAGNOSIS — E119 Type 2 diabetes mellitus without complications: Secondary | ICD-10-CM | POA: Diagnosis not present

## 2016-08-22 DIAGNOSIS — G4733 Obstructive sleep apnea (adult) (pediatric): Secondary | ICD-10-CM | POA: Diagnosis not present

## 2016-10-04 ENCOUNTER — Other Ambulatory Visit: Payer: Self-pay | Admitting: Physician Assistant

## 2016-10-31 DIAGNOSIS — G4733 Obstructive sleep apnea (adult) (pediatric): Secondary | ICD-10-CM | POA: Diagnosis not present

## 2016-12-16 DIAGNOSIS — N201 Calculus of ureter: Secondary | ICD-10-CM | POA: Diagnosis not present

## 2016-12-20 DIAGNOSIS — G4733 Obstructive sleep apnea (adult) (pediatric): Secondary | ICD-10-CM | POA: Diagnosis not present

## 2016-12-20 DIAGNOSIS — I10 Essential (primary) hypertension: Secondary | ICD-10-CM | POA: Diagnosis not present

## 2016-12-20 DIAGNOSIS — E1165 Type 2 diabetes mellitus with hyperglycemia: Secondary | ICD-10-CM | POA: Diagnosis not present

## 2016-12-20 DIAGNOSIS — E119 Type 2 diabetes mellitus without complications: Secondary | ICD-10-CM | POA: Diagnosis not present

## 2017-01-13 DIAGNOSIS — N202 Calculus of kidney with calculus of ureter: Secondary | ICD-10-CM | POA: Diagnosis not present

## 2017-03-26 DIAGNOSIS — Z024 Encounter for examination for driving license: Secondary | ICD-10-CM | POA: Diagnosis not present

## 2017-07-05 DIAGNOSIS — J069 Acute upper respiratory infection, unspecified: Secondary | ICD-10-CM | POA: Diagnosis not present

## 2017-07-18 DIAGNOSIS — J209 Acute bronchitis, unspecified: Secondary | ICD-10-CM | POA: Diagnosis not present

## 2017-07-31 ENCOUNTER — Other Ambulatory Visit: Payer: Self-pay | Admitting: Internal Medicine

## 2017-07-31 DIAGNOSIS — I498 Other specified cardiac arrhythmias: Secondary | ICD-10-CM

## 2017-08-05 ENCOUNTER — Ambulatory Visit (HOSPITAL_COMMUNITY): Payer: BLUE CROSS/BLUE SHIELD | Attending: Cardiovascular Disease

## 2017-08-05 ENCOUNTER — Other Ambulatory Visit: Payer: Self-pay

## 2017-08-05 DIAGNOSIS — E119 Type 2 diabetes mellitus without complications: Secondary | ICD-10-CM | POA: Diagnosis not present

## 2017-08-05 DIAGNOSIS — I498 Other specified cardiac arrhythmias: Secondary | ICD-10-CM | POA: Diagnosis not present

## 2017-08-05 DIAGNOSIS — I10 Essential (primary) hypertension: Secondary | ICD-10-CM | POA: Diagnosis not present

## 2017-08-05 DIAGNOSIS — I340 Nonrheumatic mitral (valve) insufficiency: Secondary | ICD-10-CM | POA: Insufficient documentation

## 2017-08-05 LAB — ECHOCARDIOGRAM COMPLETE
CHL CUP MV DEC (S): 296
E/e' ratio: 8.2
EWDT: 296 ms
FS: 54 % — AB (ref 28–44)
IV/PV OW: 0.69
LA diam index: 1.27 cm/m2
LA vol index: 9.7 mL/m2
LA vol: 25.9 mL
LASIZE: 34 mm
LAVOLA4C: 18.7 mL
LEFT ATRIUM END SYS DIAM: 34 mm
LV TDI E'LATERAL: 5.44
LVEEAVG: 8.2
LVEEMED: 8.2
LVELAT: 5.44 cm/s
LVOT VTI: 13.7 cm
LVOT peak vel: 66.5 cm/s
Lateral S' vel: 17.8 cm/s
MV pk E vel: 44.6 m/s
MVPKAVEL: 61.6 m/s
PW: 13 mm — AB (ref 0.6–1.1)
TAPSE: 25.6 mm
TDI e' medial: 5.66

## 2017-10-03 DIAGNOSIS — E119 Type 2 diabetes mellitus without complications: Secondary | ICD-10-CM | POA: Diagnosis not present

## 2018-03-10 DIAGNOSIS — Z024 Encounter for examination for driving license: Secondary | ICD-10-CM | POA: Diagnosis not present

## 2018-03-24 DIAGNOSIS — Z23 Encounter for immunization: Secondary | ICD-10-CM | POA: Diagnosis not present

## 2018-03-24 DIAGNOSIS — I10 Essential (primary) hypertension: Secondary | ICD-10-CM | POA: Diagnosis not present

## 2018-03-24 DIAGNOSIS — E1169 Type 2 diabetes mellitus with other specified complication: Secondary | ICD-10-CM | POA: Diagnosis not present

## 2018-03-24 DIAGNOSIS — G4733 Obstructive sleep apnea (adult) (pediatric): Secondary | ICD-10-CM | POA: Diagnosis not present

## 2018-03-24 DIAGNOSIS — E782 Mixed hyperlipidemia: Secondary | ICD-10-CM | POA: Diagnosis not present

## 2018-04-02 DIAGNOSIS — G4733 Obstructive sleep apnea (adult) (pediatric): Secondary | ICD-10-CM | POA: Diagnosis not present

## 2018-08-04 DIAGNOSIS — Z0000 Encounter for general adult medical examination without abnormal findings: Secondary | ICD-10-CM | POA: Diagnosis not present

## 2018-08-04 DIAGNOSIS — E782 Mixed hyperlipidemia: Secondary | ICD-10-CM | POA: Diagnosis not present

## 2018-08-04 DIAGNOSIS — I10 Essential (primary) hypertension: Secondary | ICD-10-CM | POA: Diagnosis not present

## 2018-08-04 DIAGNOSIS — E1169 Type 2 diabetes mellitus with other specified complication: Secondary | ICD-10-CM | POA: Diagnosis not present

## 2018-08-04 DIAGNOSIS — N200 Calculus of kidney: Secondary | ICD-10-CM | POA: Diagnosis not present

## 2018-09-08 DIAGNOSIS — M549 Dorsalgia, unspecified: Secondary | ICD-10-CM | POA: Diagnosis not present

## 2018-09-08 DIAGNOSIS — F418 Other specified anxiety disorders: Secondary | ICD-10-CM | POA: Diagnosis not present

## 2018-09-08 DIAGNOSIS — E1169 Type 2 diabetes mellitus with other specified complication: Secondary | ICD-10-CM | POA: Diagnosis not present

## 2019-02-17 DIAGNOSIS — E119 Type 2 diabetes mellitus without complications: Secondary | ICD-10-CM | POA: Diagnosis not present

## 2019-02-17 DIAGNOSIS — H40013 Open angle with borderline findings, low risk, bilateral: Secondary | ICD-10-CM | POA: Diagnosis not present

## 2019-03-08 DIAGNOSIS — Z029 Encounter for administrative examinations, unspecified: Secondary | ICD-10-CM | POA: Diagnosis not present

## 2019-06-29 DIAGNOSIS — Z20822 Contact with and (suspected) exposure to covid-19: Secondary | ICD-10-CM | POA: Diagnosis not present

## 2019-08-03 DIAGNOSIS — R05 Cough: Secondary | ICD-10-CM | POA: Diagnosis not present

## 2019-08-03 DIAGNOSIS — R197 Diarrhea, unspecified: Secondary | ICD-10-CM | POA: Diagnosis not present

## 2019-08-04 DIAGNOSIS — Z1152 Encounter for screening for COVID-19: Secondary | ICD-10-CM | POA: Diagnosis not present

## 2019-08-04 DIAGNOSIS — R05 Cough: Secondary | ICD-10-CM | POA: Diagnosis not present

## 2019-08-12 DIAGNOSIS — J208 Acute bronchitis due to other specified organisms: Secondary | ICD-10-CM | POA: Diagnosis not present

## 2019-10-07 DIAGNOSIS — Z125 Encounter for screening for malignant neoplasm of prostate: Secondary | ICD-10-CM | POA: Diagnosis not present

## 2019-10-07 DIAGNOSIS — I498 Other specified cardiac arrhythmias: Secondary | ICD-10-CM | POA: Diagnosis not present

## 2019-10-07 DIAGNOSIS — I10 Essential (primary) hypertension: Secondary | ICD-10-CM | POA: Diagnosis not present

## 2019-10-07 DIAGNOSIS — Z0000 Encounter for general adult medical examination without abnormal findings: Secondary | ICD-10-CM | POA: Diagnosis not present

## 2019-10-07 DIAGNOSIS — E1169 Type 2 diabetes mellitus with other specified complication: Secondary | ICD-10-CM | POA: Diagnosis not present

## 2019-10-07 DIAGNOSIS — N200 Calculus of kidney: Secondary | ICD-10-CM | POA: Diagnosis not present

## 2019-10-07 DIAGNOSIS — E782 Mixed hyperlipidemia: Secondary | ICD-10-CM | POA: Diagnosis not present

## 2019-11-29 DIAGNOSIS — E1169 Type 2 diabetes mellitus with other specified complication: Secondary | ICD-10-CM | POA: Diagnosis not present

## 2019-11-29 DIAGNOSIS — I10 Essential (primary) hypertension: Secondary | ICD-10-CM | POA: Diagnosis not present

## 2019-12-10 DIAGNOSIS — Z713 Dietary counseling and surveillance: Secondary | ICD-10-CM | POA: Diagnosis not present

## 2019-12-27 ENCOUNTER — Other Ambulatory Visit: Payer: Self-pay | Admitting: Gastroenterology

## 2019-12-27 DIAGNOSIS — Z01818 Encounter for other preprocedural examination: Secondary | ICD-10-CM | POA: Diagnosis not present

## 2020-01-05 DIAGNOSIS — Z713 Dietary counseling and surveillance: Secondary | ICD-10-CM | POA: Diagnosis not present

## 2020-02-08 DIAGNOSIS — Z713 Dietary counseling and surveillance: Secondary | ICD-10-CM | POA: Diagnosis not present

## 2020-02-09 NOTE — Progress Notes (Signed)
Attempted to obtain medical history via telephone, unable to reach at this time. I left a voicemail to return pre surgical testing department's phone call.  

## 2020-02-16 ENCOUNTER — Other Ambulatory Visit: Payer: Self-pay | Admitting: Gastroenterology

## 2020-02-18 ENCOUNTER — Inpatient Hospital Stay (HOSPITAL_COMMUNITY): Admission: RE | Admit: 2020-02-18 | Payer: BLUE CROSS/BLUE SHIELD | Source: Ambulatory Visit

## 2020-02-21 NOTE — Progress Notes (Signed)
Called patient and was informed this colonoscopy was rescheduled due to patient unable to get off work . They will reschedule with dr.schooler.

## 2020-02-22 ENCOUNTER — Encounter (HOSPITAL_COMMUNITY): Admission: RE | Payer: Self-pay | Source: Home / Self Care

## 2020-02-22 ENCOUNTER — Ambulatory Visit (HOSPITAL_COMMUNITY)
Admission: RE | Admit: 2020-02-22 | Payer: BC Managed Care – PPO | Source: Home / Self Care | Admitting: Gastroenterology

## 2020-02-22 SURGERY — COLONOSCOPY WITH PROPOFOL
Anesthesia: Monitor Anesthesia Care

## 2020-02-23 ENCOUNTER — Other Ambulatory Visit: Payer: Self-pay | Admitting: Urology

## 2020-02-29 DIAGNOSIS — R200 Anesthesia of skin: Secondary | ICD-10-CM | POA: Diagnosis not present

## 2020-02-29 DIAGNOSIS — E1169 Type 2 diabetes mellitus with other specified complication: Secondary | ICD-10-CM | POA: Diagnosis not present

## 2020-02-29 DIAGNOSIS — I10 Essential (primary) hypertension: Secondary | ICD-10-CM | POA: Diagnosis not present

## 2020-03-31 DIAGNOSIS — Z23 Encounter for immunization: Secondary | ICD-10-CM | POA: Diagnosis not present

## 2020-04-14 DIAGNOSIS — Z713 Dietary counseling and surveillance: Secondary | ICD-10-CM | POA: Diagnosis not present

## 2020-04-25 DIAGNOSIS — G4733 Obstructive sleep apnea (adult) (pediatric): Secondary | ICD-10-CM | POA: Diagnosis not present

## 2020-05-15 DIAGNOSIS — H6501 Acute serous otitis media, right ear: Secondary | ICD-10-CM | POA: Diagnosis not present

## 2020-05-16 DIAGNOSIS — M654 Radial styloid tenosynovitis [de Quervain]: Secondary | ICD-10-CM | POA: Diagnosis not present

## 2020-05-16 DIAGNOSIS — S62001D Unspecified fracture of navicular [scaphoid] bone of right wrist, subsequent encounter for fracture with routine healing: Secondary | ICD-10-CM | POA: Diagnosis not present

## 2020-06-04 ENCOUNTER — Telehealth (HOSPITAL_COMMUNITY): Payer: Self-pay | Admitting: Adult Health

## 2020-06-04 NOTE — Telephone Encounter (Signed)
Called and LMOM regarding monoclonal antibody treatment for COVID 19 given to those who are at risk for complications and/or hospitalization of the virus.  Lillard Anes, NP

## 2020-06-05 ENCOUNTER — Other Ambulatory Visit: Payer: Self-pay

## 2020-06-05 ENCOUNTER — Emergency Department (HOSPITAL_COMMUNITY)
Admission: EM | Admit: 2020-06-05 | Discharge: 2020-06-06 | Disposition: A | Discharge status: Home / Self Care | Payer: BC Managed Care – PPO | Attending: Emergency Medicine | Admitting: Emergency Medicine

## 2020-06-05 ENCOUNTER — Emergency Department (HOSPITAL_COMMUNITY): Payer: BC Managed Care – PPO

## 2020-06-05 DIAGNOSIS — R42 Dizziness and giddiness: Secondary | ICD-10-CM | POA: Diagnosis not present

## 2020-06-05 DIAGNOSIS — Z743 Need for continuous supervision: Secondary | ICD-10-CM | POA: Diagnosis not present

## 2020-06-05 DIAGNOSIS — H748X1 Other specified disorders of right middle ear and mastoid: Secondary | ICD-10-CM | POA: Diagnosis not present

## 2020-06-05 DIAGNOSIS — I10 Essential (primary) hypertension: Secondary | ICD-10-CM | POA: Insufficient documentation

## 2020-06-05 DIAGNOSIS — R457 State of emotional shock and stress, unspecified: Secondary | ICD-10-CM | POA: Diagnosis not present

## 2020-06-05 DIAGNOSIS — R000 Tachycardia, unspecified: Secondary | ICD-10-CM | POA: Diagnosis not present

## 2020-06-05 DIAGNOSIS — R200 Anesthesia of skin: Secondary | ICD-10-CM | POA: Diagnosis not present

## 2020-06-05 DIAGNOSIS — Z79899 Other long term (current) drug therapy: Secondary | ICD-10-CM | POA: Diagnosis not present

## 2020-06-05 DIAGNOSIS — J320 Chronic maxillary sinusitis: Secondary | ICD-10-CM | POA: Diagnosis not present

## 2020-06-05 DIAGNOSIS — U071 COVID-19: Secondary | ICD-10-CM | POA: Diagnosis not present

## 2020-06-05 DIAGNOSIS — R4182 Altered mental status, unspecified: Secondary | ICD-10-CM | POA: Diagnosis not present

## 2020-06-05 DIAGNOSIS — J3489 Other specified disorders of nose and nasal sinuses: Secondary | ICD-10-CM | POA: Diagnosis not present

## 2020-06-05 DIAGNOSIS — H8193 Unspecified disorder of vestibular function, bilateral: Secondary | ICD-10-CM | POA: Diagnosis not present

## 2020-06-05 LAB — COMPREHENSIVE METABOLIC PANEL
ALT: 31 U/L (ref 0–44)
AST: 19 U/L (ref 15–41)
Albumin: 4.1 g/dL (ref 3.5–5.0)
Alkaline Phosphatase: 69 U/L (ref 38–126)
Anion gap: 13 (ref 5–15)
BUN: 18 mg/dL (ref 6–20)
CO2: 21 mmol/L — ABNORMAL LOW (ref 22–32)
Calcium: 9.3 mg/dL (ref 8.9–10.3)
Chloride: 103 mmol/L (ref 98–111)
Creatinine, Ser: 0.94 mg/dL (ref 0.61–1.24)
GFR, Estimated: >60 mL/min (ref >60)
Glucose, Bld: 240 mg/dL — ABNORMAL HIGH (ref 70–99)
Potassium: 3.7 mmol/L (ref 3.5–5.1)
Sodium: 137 mmol/L (ref 135–145)
Total Bilirubin: 0.3 mg/dL (ref 0.3–1.2)
Total Protein: 8 g/dL (ref 6.5–8.1)

## 2020-06-05 LAB — I-STAT CHEM 8, ED
BUN: 21 mg/dL — ABNORMAL HIGH (ref 6–20)
Calcium, Ion: 1.14 mmol/L — ABNORMAL LOW (ref 1.15–1.40)
Chloride: 103 mmol/L (ref 98–111)
Creatinine, Ser: 0.9 mg/dL (ref 0.61–1.24)
Glucose, Bld: 231 mg/dL — ABNORMAL HIGH (ref 70–99)
HCT: 50 % (ref 39.0–52.0)
Hemoglobin: 17 g/dL (ref 13.0–17.0)
Potassium: 3.6 mmol/L (ref 3.5–5.1)
Sodium: 140 mmol/L (ref 135–145)
TCO2: 24 mmol/L (ref 22–32)

## 2020-06-05 LAB — DIFFERENTIAL
Abs Immature Granulocytes: 0.02 10*3/uL (ref 0.00–0.07)
Basophils Absolute: 0 10*3/uL (ref 0.0–0.1)
Basophils Relative: 0 %
Eosinophils Absolute: 0.1 10*3/uL (ref 0.0–0.5)
Eosinophils Relative: 1 %
Immature Granulocytes: 0 %
Lymphocytes Relative: 17 %
Lymphs Abs: 1.3 10*3/uL (ref 0.7–4.0)
Monocytes Absolute: 0.5 10*3/uL (ref 0.1–1.0)
Monocytes Relative: 6 %
Neutro Abs: 5.7 10*3/uL (ref 1.7–7.7)
Neutrophils Relative %: 76 %

## 2020-06-05 LAB — PROTIME-INR
INR: 1 (ref 0.8–1.2)
Prothrombin Time: 12.5 seconds (ref 11.4–15.2)

## 2020-06-05 LAB — CBC
HCT: 48.7 % (ref 39.0–52.0)
Hemoglobin: 17.1 g/dL — ABNORMAL HIGH (ref 13.0–17.0)
MCH: 32.3 pg (ref 26.0–34.0)
MCHC: 35.1 g/dL (ref 30.0–36.0)
MCV: 91.9 fL (ref 80.0–100.0)
Platelets: 167 10*3/uL (ref 150–400)
RBC: 5.3 MIL/uL (ref 4.22–5.81)
RDW: 12.5 % (ref 11.5–15.5)
WBC: 7.6 10*3/uL (ref 4.0–10.5)
nRBC: 0 % (ref 0.0–0.2)

## 2020-06-05 LAB — RESP PANEL BY RT-PCR (FLU A&B, COVID) ARPGX2
Influenza A by PCR: NEGATIVE
Influenza B by PCR: NEGATIVE
SARS Coronavirus 2 by RT PCR: POSITIVE — AB

## 2020-06-05 LAB — APTT: aPTT: 25 seconds (ref 24–36)

## 2020-06-05 MED ORDER — SODIUM CHLORIDE 0.9% FLUSH: 3.0000 mL | Freq: Once | INTRAVENOUS | Status: DC

## 2020-06-05 NOTE — ED Provider Notes (Signed)
[facility] EMERGENCY DEPARTMENT Provider Note   CSN: [id number] Arrival date & time: 06/05/20  1906    History Chief Complaint  Patient presents with   Altered Mental Status    Christopher Oliver is a 54 y.o. male.  The history is provided by the patient.  Altered Mental Status He has history of hypertension, diverticulitis was diagnosed with COVID-19 4 days ago and comes in because of an episode where he noticed numbness in his face and hands and states that he just did not feel right.  He noted that he was off balance when he stood up.  The numbness is mostly resolved but he still just does not feel right.  He is difficulty putting the way he feels into words.  He is not feeling short of breath and he denies any cough.  He has not lost his sense of smell or taste.  He has received 1 dose of Covid vaccine.  Past Medical History:  Diagnosis Date   Diverticulitis    Hypertension    Kidney stone     There are no problems to display for this patient.   Past Surgical History:  Procedure Laterality Date   ABDOMINAL SURGERY     ANKLE FRACTURE SURGERY     ORIF TIBIA & FIBULA FRACTURES         No family history on file.  Social History   Tobacco Use   Smoking status: Never Smoker  Substance Use Topics   Alcohol use: Yes    Comment: occ   Drug use: No    Home Medications Prior to Admission medications   Medication Sig Start Date End Date Taking? Authorizing Provider  allopurinol (ZYLOPRIM) 300 MG tablet Take 300 mg by mouth daily.    [provider]  HYDROcodone-acetaminophen (NORCO/VICODIN) 5-325 MG per tablet Take 1 tablet by mouth every 4 (four) hours as needed for moderate pain or severe pain. Every 4-6 hours    [provider]  HYDROmorphone (DILAUDID) 2 MG tablet Take 2-4 mg by mouth every 4 (four) hours as needed for moderate pain or severe pain.    [provider]  HYDROmorphone (DILAUDID) 2 MG tablet Take 1  tablet (2 mg total) by mouth every 4 (four) hours as needed for severe pain. 01/15/15   Vanetta Mulders, MD  lisinopril (PRINIVIL,ZESTRIL) 20 MG tablet Take 20 mg by mouth daily.    [provider]  naproxen sodium (ANAPROX) 220 MG tablet Take 220 mg by mouth 2 (two) times daily as needed (pain).    [provider]  ondansetron (ZOFRAN ODT) 4 MG disintegrating tablet Take 1 tablet (4 mg total) by mouth every 8 (eight) hours as needed for nausea or vomiting. 01/15/15   Vanetta Mulders, MD  simvastatin (ZOCOR) 20 MG tablet Take 20 mg by mouth every evening.    [provider]  tamsulosin (FLOMAX) 0.4 MG CAPS capsule TAKE ONE (1) CAPSULE EACH DAY 02/23/20   Bjorn Pippin, MD    Allergies    Penicillins  Review of Systems   Review of Systems  All other systems reviewed and are negative.   Physical Exam Updated Vital Signs BP (!) 175/91 (BP Location: Left Arm)    Pulse (!) 116    Temp 98.6 F (37 C) (Oral)    Resp 20    SpO2 98%   Physical Exam Vitals and nursing note reviewed.   Obese 54 year old male, resting comfortably and in no  acute distress. Vital signs are significant for elevated heart rate and blood pressure. Oxygen saturation is 98%, which is normal. Head is normocephalic and atraumatic. PERRLA, EOMI. Oropharynx is clear.  There are no carotid bruits. Neck is nontender and supple without adenopathy or JVD. Back is nontender and there is no CVA tenderness. Lungs are clear without rales, wheezes, or rhonchi. Chest is nontender. Heart has regular rate and rhythm without murmur. Abdomen is soft, flat, nontender without masses or hepatosplenomegaly and peristalsis is normoactive. Extremities have no cyanosis or edema, full range of motion is present. Skin is warm and dry without rash. Neurologic: Mental status is normal, cranial nerves are intact, there are no motor or sensory deficits.  Finger-to-nose testing is normal.  On Romberg testing, he is generally  unsteady but does not fall in any 1 direction consistently.  No nystagmus is seen.  ED Results / Procedures / Treatments   Labs (all labs ordered are listed, but only abnormal results are displayed) Labs Reviewed  RESP PANEL BY RT-PCR (FLU A&B, COVID) ARPGX2 - Abnormal; Notable for the following components:      Result Value   SARS Coronavirus 2 by RT PCR POSITIVE (*)    All other components within normal limits  CBC - Abnormal; Notable for the following components:   Hemoglobin 17.1 (*)    All other components within normal limits  COMPREHENSIVE METABOLIC PANEL - Abnormal; Notable for the following components:   CO2 21 (*)    Glucose, Bld 240 (*)    All other components within normal limits  I-STAT CHEM 8, ED - Abnormal; Notable for the following components:   BUN 21 (*)    Glucose, Bld 231 (*)    Calcium, Ion 1.14 (*)    All other components within normal limits  PROTIME-INR  APTT  DIFFERENTIAL  CBG MONITORING, ED    EKG EKG Interpretation  Date/Time:  Monday June 05 2020 19:21:02 EST Ventricular Rate:  113 PR Interval:  146 QRS Duration: 90 QT Interval:  326 QTC Calculation: 447 R Axis:   -4 Text Interpretation: Sinus tachycardia Otherwise normal ECG When compared with ECG of 12/07/2009, HEART RATE has increased Confirmed by Dione Booze ([id number]) on 06/05/2020 11:38:33 PM   Radiology CT HEAD WO CONTRAST  Result Date: 06/05/2020 CLINICAL DATA:  Intermittent numbness at 6 p.m., COVID-19 positive 05/31/2020 EXAM: CT HEAD WITHOUT CONTRAST TECHNIQUE: Contiguous axial images were obtained from the base of the skull through the vertex without intravenous contrast. COMPARISON:  None. FINDINGS: Brain: No acute infarct or hemorrhage. Lateral ventricles and midline structures are unremarkable. No acute extra-axial fluid collections. No mass effect. Vascular: No hyperdense vessel or unexpected calcification. Skull: Normal. Negative for fracture or focal lesion. Sinuses/Orbits:  There is partial opacification of the right mastoid air cells. Mucoperiosteal thickening of the bilateral maxillary sinuses, anterior ethmoid air cells, and right sphenoid sinus. Other: None. IMPRESSION: 1. Paranasal sinus disease as above. 2. Right mastoid effusion. 3. No acute intracranial process. Electronically Signed   By: Sharlet Salina M.D.   On: 06/05/2020 19:55   MR BRAIN WO CONTRAST  Result Date: 06/06/2020 CLINICAL DATA:  Vertigo EXAM: MRI HEAD WITHOUT CONTRAST TECHNIQUE: Multiplanar, multiecho pulse sequences of the brain and surrounding structures were obtained without intravenous contrast. COMPARISON:  None. FINDINGS: Brain: No acute infarct, mass effect or extra-axial collection. No acute or chronic hemorrhage. Normal Piccione matter signal, parenchymal volume and CSF spaces. The midline structures are normal. Vascular: Major flow voids are preserved.  Skull and upper cervical spine: Normal calvarium and skull base. Visualized upper cervical spine and soft tissues are normal. Sinuses/Orbits:Right mastoid effusion. Normal nasopharynx. Normal orbits. IMPRESSION: 1. Normal MRI of the brain. 2. Right mastoid effusion. Electronically Signed   By: Deatra Robinson M.D.   On: 06/06/2020 03:36    Procedures Procedures   Medications Ordered in ED Medications  sodium chloride flush (NS) 0.9 % injection 3 mL (0 mLs Intravenous Hold 06/06/20 0451)  doxycycline (VIBRA-TABS) tablet 100 mg (has no administration in time range)    ED Course  I have reviewed the triage vital signs and the nursing notes.  Pertinent labs & imaging results that were available during my care of the patient were reviewed by me and considered in my medical decision making (see chart for details).  MDM Rules/Calculators/A&P Make sense of not feeling well and patient who is Covid positive.  Initial blood pressure and heart rate were elevated.  After getting settled into his room, heart rate has come down to normal and blood  pressure has come down to normal.  I am concerned about possibility of central vertigo.  Labs show mildly elevated glucose which is not high enough to account for symptoms.  There is borderline polycythemia which is also not high enough to account for symptoms.  Respiratory panel is positive for COVID-19.  CT shows evidence of sinus disease but no intracranial process.  Will send for MRI to rule out posterior circulation stroke.  If negative, will plan to treat for sinusitis.  Old records are reviewed, and he did see an ENT physician on 12/6 and diagnosed with otitis media and treated with fluticasone nasal spray.  MRI is normal except for evidence of right mastoid effusion.  Patient states that he feels like he is back to normal now.  He will be referred to neurology for further outpatient evaluation, and referred back to his ENT physician for ongoing management of his sinuses/mastoid disease.  He is discharged with prescription for doxycycline.  Final Clinical Impression(s) / ED Diagnoses Final diagnoses:  Dysequilibrium    Rx / DC Orders ED Discharge Orders         Ordered    doxycycline (VIBRAMYCIN) 100 MG capsule  2 times daily        06/06/20 9833           Dione Booze, MD 06/06/20 0630

## 2020-06-05 NOTE — ED Triage Notes (Addendum)
Pt presents to ED POV. Pt continually states "I don't feel right." pt states that at 1800 he felt an intermittent numbness/"fog" feeling go over his body. Pt states that there is something going on in his brain. Pt AAO x4. Pt tested +covid on 05/31/20. Pt partially vaccinated

## 2020-06-06 ENCOUNTER — Emergency Department (HOSPITAL_COMMUNITY): Payer: BC Managed Care – PPO

## 2020-06-06 DIAGNOSIS — R42 Dizziness and giddiness: Secondary | ICD-10-CM | POA: Diagnosis not present

## 2020-06-06 MED ORDER — DOXYCYCLINE HYCLATE 100 MG PO TABS
100.0000 mg | ORAL_TABLET | Freq: Once | ORAL | Status: AC
  Administered 2020-06-06: 100 mg via ORAL
  Filled 2020-06-06: qty 1

## 2020-06-06 MED ORDER — DOXYCYCLINE HYCLATE 100 MG PO CAPS: 100.0000 mg | ORAL_CAPSULE | Freq: Two times a day (BID) | ORAL | 0 refills | Status: DC

## 2020-06-06 NOTE — ED Notes (Signed)
Patient in MRI 

## 2020-06-06 NOTE — Discharge Instructions (Addendum)
Your evaluation today showed some fluid in the mastoid which is a bone near the right ear, and some inflammation in your sinuses.  You have been given an antibiotic for these problems.  The rest of your evaluation was normal.  Please follow-up with your primary care provider and the ENT physician.  If you are concerned about the events of today, you may also follow-up with the neurologist for another opinion.

## 2020-06-30 DIAGNOSIS — E1169 Type 2 diabetes mellitus with other specified complication: Secondary | ICD-10-CM | POA: Diagnosis not present

## 2020-06-30 DIAGNOSIS — I10 Essential (primary) hypertension: Secondary | ICD-10-CM | POA: Diagnosis not present

## 2020-06-30 DIAGNOSIS — Z23 Encounter for immunization: Secondary | ICD-10-CM | POA: Diagnosis not present

## 2020-07-20 DIAGNOSIS — Z713 Dietary counseling and surveillance: Secondary | ICD-10-CM | POA: Diagnosis not present

## 2020-08-16 ENCOUNTER — Ambulatory Visit: Payer: BC Managed Care – PPO | Admitting: Diagnostic Neuroimaging

## 2020-08-16 ENCOUNTER — Encounter: Payer: Self-pay | Admitting: Diagnostic Neuroimaging

## 2020-08-16 ENCOUNTER — Other Ambulatory Visit: Payer: Self-pay

## 2020-08-16 VITALS — BP 145/95 | HR 77 | Ht 67.0 in | Wt 316.0 lb

## 2020-08-16 DIAGNOSIS — R42 Dizziness and giddiness: Secondary | ICD-10-CM

## 2020-08-16 DIAGNOSIS — Z8616 Personal history of COVID-19: Secondary | ICD-10-CM
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
## 2020-10-17 DIAGNOSIS — Z713 Dietary counseling and surveillance: Secondary | ICD-10-CM | POA: Diagnosis not present

## 2020-11-15 DIAGNOSIS — Z0000 Encounter for general adult medical examination without abnormal findings: Secondary | ICD-10-CM | POA: Diagnosis not present

## 2020-11-15 DIAGNOSIS — E782 Mixed hyperlipidemia: Secondary | ICD-10-CM | POA: Diagnosis not present

## 2020-11-15 DIAGNOSIS — E1169 Type 2 diabetes mellitus with other specified complication: Secondary | ICD-10-CM | POA: Diagnosis not present

## 2020-11-15 DIAGNOSIS — I10 Essential (primary) hypertension: Secondary | ICD-10-CM | POA: Diagnosis not present

## 2020-11-15 DIAGNOSIS — N200 Calculus of kidney: Secondary | ICD-10-CM | POA: Diagnosis not present

## 2020-11-15 DIAGNOSIS — I498 Other specified cardiac arrhythmias: Secondary | ICD-10-CM | POA: Diagnosis not present

## 2020-11-15 DIAGNOSIS — G4733 Obstructive sleep apnea (adult) (pediatric): Secondary | ICD-10-CM | POA: Diagnosis not present

## 2020-12-18 DIAGNOSIS — U071 COVID-19: Secondary | ICD-10-CM | POA: Diagnosis not present

## 2021-03-14 DIAGNOSIS — G4733 Obstructive sleep apnea (adult) (pediatric): Secondary | ICD-10-CM | POA: Diagnosis not present

## 2021-03-21 DIAGNOSIS — E1169 Type 2 diabetes mellitus with other specified complication: Secondary | ICD-10-CM | POA: Diagnosis not present

## 2021-12-25 DIAGNOSIS — I10 Essential (primary) hypertension: Secondary | ICD-10-CM | POA: Diagnosis not present

## 2021-12-25 DIAGNOSIS — E782 Mixed hyperlipidemia: Secondary | ICD-10-CM | POA: Diagnosis not present

## 2021-12-25 DIAGNOSIS — N200 Calculus of kidney: Secondary | ICD-10-CM | POA: Diagnosis not present

## 2021-12-25 DIAGNOSIS — G4733 Obstructive sleep apnea (adult) (pediatric): Secondary | ICD-10-CM | POA: Diagnosis not present

## 2021-12-25 DIAGNOSIS — E1169 Type 2 diabetes mellitus with other specified complication: Secondary | ICD-10-CM | POA: Diagnosis not present

## 2021-12-25 DIAGNOSIS — Z0000 Encounter for general adult medical examination without abnormal findings: Secondary | ICD-10-CM | POA: Diagnosis not present

## 2021-12-25 DIAGNOSIS — Z23 Encounter for immunization: Secondary | ICD-10-CM | POA: Diagnosis not present

## 2022-03-14 DIAGNOSIS — I10 Essential (primary) hypertension: Secondary | ICD-10-CM | POA: Diagnosis not present

## 2022-03-14 DIAGNOSIS — G4733 Obstructive sleep apnea (adult) (pediatric): Secondary | ICD-10-CM | POA: Diagnosis not present

## 2022-04-22 DIAGNOSIS — E785 Hyperlipidemia, unspecified: Secondary | ICD-10-CM | POA: Diagnosis not present

## 2022-04-22 DIAGNOSIS — Z23 Encounter for immunization: Secondary | ICD-10-CM | POA: Diagnosis not present

## 2022-04-22 DIAGNOSIS — E669 Obesity, unspecified: Secondary | ICD-10-CM | POA: Diagnosis not present

## 2022-04-22 DIAGNOSIS — N200 Calculus of kidney: Secondary | ICD-10-CM | POA: Diagnosis not present

## 2022-04-22 DIAGNOSIS — I10 Essential (primary) hypertension: Secondary | ICD-10-CM | POA: Diagnosis not present

## 2022-04-22 DIAGNOSIS — E1169 Type 2 diabetes mellitus with other specified complication: Secondary | ICD-10-CM | POA: Diagnosis not present

## 2022-04-22 DIAGNOSIS — E782 Mixed hyperlipidemia: Secondary | ICD-10-CM | POA: Diagnosis not present

## 2022-09-18 DIAGNOSIS — E782 Mixed hyperlipidemia: Secondary | ICD-10-CM | POA: Diagnosis not present

## 2022-09-18 DIAGNOSIS — Z23 Encounter for immunization: Secondary | ICD-10-CM | POA: Diagnosis not present

## 2022-09-18 DIAGNOSIS — E1169 Type 2 diabetes mellitus with other specified complication: Secondary | ICD-10-CM | POA: Diagnosis not present

## 2022-09-18 DIAGNOSIS — G4733 Obstructive sleep apnea (adult) (pediatric): Secondary | ICD-10-CM | POA: Diagnosis not present

## 2022-09-18 DIAGNOSIS — I10 Essential (primary) hypertension: Secondary | ICD-10-CM | POA: Diagnosis not present

## 2022-11-15 DIAGNOSIS — K5730 Diverticulosis of large intestine without perforation or abscess without bleeding: Secondary | ICD-10-CM | POA: Diagnosis not present

## 2022-11-15 DIAGNOSIS — D219 Benign neoplasm of connective and other soft tissue, unspecified: Secondary | ICD-10-CM | POA: Diagnosis not present

## 2022-11-15 DIAGNOSIS — Z980 Intestinal bypass and anastomosis status: Secondary | ICD-10-CM | POA: Diagnosis not present

## 2022-11-15 DIAGNOSIS — Z1211 Encounter for screening for malignant neoplasm of colon: Secondary | ICD-10-CM | POA: Diagnosis not present

## 2022-11-15 DIAGNOSIS — K648 Other hemorrhoids: Secondary | ICD-10-CM | POA: Diagnosis not present

## 2022-11-20 DIAGNOSIS — D219 Benign neoplasm of connective and other soft tissue, unspecified: Secondary | ICD-10-CM | POA: Diagnosis not present

## 2022-12-16 ENCOUNTER — Ambulatory Visit: Payer: BC Managed Care – PPO | Admitting: Urology

## 2022-12-16 ENCOUNTER — Ambulatory Visit
Admission: RE | Admit: 2022-12-16 | Discharge: 2022-12-16 | Disposition: A | Discharge status: Home / Self Care | Payer: BC Managed Care – PPO | Source: Ambulatory Visit | Attending: Urology | Admitting: Urology

## 2022-12-16 VITALS — BP 150/78 | HR 74 | Ht 67.0 in | Wt 290.0 lb

## 2022-12-16 DIAGNOSIS — Z87442 Personal history of urinary calculi: Secondary | ICD-10-CM

## 2022-12-16 DIAGNOSIS — R1032 Left lower quadrant pain: Secondary | ICD-10-CM | POA: Diagnosis not present

## 2022-12-16 DIAGNOSIS — N200 Calculus of kidney: Secondary | ICD-10-CM | POA: Diagnosis not present
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
## 2022-12-17 ENCOUNTER — Encounter: Payer: Self-pay | Admitting: Urology

## 2022-12-17 NOTE — Patient Instructions (Signed)

## 2023-03-12 DIAGNOSIS — R52 Pain, unspecified: Secondary | ICD-10-CM | POA: Diagnosis not present

## 2023-03-12 DIAGNOSIS — R0981 Nasal congestion: Secondary | ICD-10-CM | POA: Diagnosis not present

## 2023-03-12 DIAGNOSIS — Z03818 Encounter for observation for suspected exposure to other biological agents ruled out: Secondary | ICD-10-CM | POA: Diagnosis not present

## 2023-03-12 DIAGNOSIS — H66001 Acute suppurative otitis media without spontaneous rupture of ear drum, right ear: Secondary | ICD-10-CM | POA: Diagnosis not present

## 2023-03-12 DIAGNOSIS — R509 Fever, unspecified: Secondary | ICD-10-CM | POA: Diagnosis not present

## 2023-03-14 ENCOUNTER — Other Ambulatory Visit: Payer: Self-pay

## 2023-03-14 ENCOUNTER — Inpatient Hospital Stay (HOSPITAL_COMMUNITY)
Admission: AD | Admit: 2023-03-14 | Discharge: 2023-03-19 | DRG: 871 | Disposition: A | Discharge status: Home Health | Payer: BC Managed Care – PPO | Source: Other Acute Inpatient Hospital | Attending: Internal Medicine | Admitting: Internal Medicine

## 2023-03-14 ENCOUNTER — Encounter (HOSPITAL_COMMUNITY): Payer: Self-pay

## 2023-03-14 ENCOUNTER — Emergency Department (HOSPITAL_COMMUNITY)
Admission: EM | Admit: 2023-03-14 | Discharge: 2023-03-14 | Disposition: A | Discharge status: Home / Self Care | Payer: BC Managed Care – PPO | Source: Home / Self Care | Attending: Emergency Medicine | Admitting: Emergency Medicine

## 2023-03-14 ENCOUNTER — Encounter (HOSPITAL_COMMUNITY): Payer: Self-pay | Admitting: Critical Care Medicine

## 2023-03-14 ENCOUNTER — Encounter (HOSPITAL_COMMUNITY): Payer: Self-pay | Admitting: Emergency Medicine

## 2023-03-14 ENCOUNTER — Emergency Department (HOSPITAL_COMMUNITY): Payer: BC Managed Care – PPO

## 2023-03-14 DIAGNOSIS — G9341 Metabolic encephalopathy: Secondary | ICD-10-CM | POA: Diagnosis not present

## 2023-03-14 DIAGNOSIS — T380X5A Adverse effect of glucocorticoids and synthetic analogues, initial encounter: Secondary | ICD-10-CM | POA: Diagnosis present

## 2023-03-14 DIAGNOSIS — G039 Meningitis, unspecified: Secondary | ICD-10-CM | POA: Diagnosis not present

## 2023-03-14 DIAGNOSIS — E876 Hypokalemia: Secondary | ICD-10-CM | POA: Diagnosis not present

## 2023-03-14 DIAGNOSIS — Z880 Allergy status to penicillin: Secondary | ICD-10-CM | POA: Diagnosis not present

## 2023-03-14 DIAGNOSIS — G928 Other toxic encephalopathy: Secondary | ICD-10-CM | POA: Diagnosis not present

## 2023-03-14 DIAGNOSIS — R519 Headache, unspecified: Secondary | ICD-10-CM | POA: Insufficient documentation

## 2023-03-14 DIAGNOSIS — E66813 Obesity, class 3: Secondary | ICD-10-CM | POA: Diagnosis present

## 2023-03-14 DIAGNOSIS — R0989 Other specified symptoms and signs involving the circulatory and respiratory systems: Secondary | ICD-10-CM | POA: Diagnosis not present

## 2023-03-14 DIAGNOSIS — Z1152 Encounter for screening for COVID-19: Secondary | ICD-10-CM | POA: Diagnosis not present

## 2023-03-14 DIAGNOSIS — G919 Hydrocephalus, unspecified: Secondary | ICD-10-CM | POA: Diagnosis not present

## 2023-03-14 DIAGNOSIS — E872 Acidosis, unspecified: Secondary | ICD-10-CM | POA: Diagnosis not present

## 2023-03-14 DIAGNOSIS — E785 Hyperlipidemia, unspecified: Secondary | ICD-10-CM | POA: Diagnosis not present

## 2023-03-14 DIAGNOSIS — G001 Pneumococcal meningitis: Principal | ICD-10-CM | POA: Diagnosis present

## 2023-03-14 DIAGNOSIS — R0902 Hypoxemia: Secondary | ICD-10-CM | POA: Diagnosis not present

## 2023-03-14 DIAGNOSIS — R4182 Altered mental status, unspecified: Principal | ICD-10-CM

## 2023-03-14 DIAGNOSIS — H6691 Otitis media, unspecified, right ear: Secondary | ICD-10-CM | POA: Insufficient documentation

## 2023-03-14 DIAGNOSIS — G910 Communicating hydrocephalus: Secondary | ICD-10-CM | POA: Diagnosis not present

## 2023-03-14 DIAGNOSIS — I5032 Chronic diastolic (congestive) heart failure: Secondary | ICD-10-CM | POA: Diagnosis present

## 2023-03-14 DIAGNOSIS — H66001 Acute suppurative otitis media without spontaneous rupture of ear drum, right ear: Secondary | ICD-10-CM | POA: Diagnosis present

## 2023-03-14 DIAGNOSIS — E1165 Type 2 diabetes mellitus with hyperglycemia: Secondary | ICD-10-CM | POA: Diagnosis not present

## 2023-03-14 DIAGNOSIS — R509 Fever, unspecified: Secondary | ICD-10-CM | POA: Diagnosis not present

## 2023-03-14 DIAGNOSIS — Z888 Allergy status to other drugs, medicaments and biological substances status: Secondary | ICD-10-CM

## 2023-03-14 DIAGNOSIS — Z23 Encounter for immunization: Secondary | ICD-10-CM

## 2023-03-14 DIAGNOSIS — G009 Bacterial meningitis, unspecified: Principal | ICD-10-CM | POA: Diagnosis present

## 2023-03-14 DIAGNOSIS — R4701 Aphasia: Secondary | ICD-10-CM | POA: Diagnosis present

## 2023-03-14 DIAGNOSIS — A419 Sepsis, unspecified organism: Principal | ICD-10-CM | POA: Diagnosis present

## 2023-03-14 DIAGNOSIS — H66009 Acute suppurative otitis media without spontaneous rupture of ear drum, unspecified ear: Secondary | ICD-10-CM

## 2023-03-14 DIAGNOSIS — I110 Hypertensive heart disease with heart failure: Secondary | ICD-10-CM | POA: Diagnosis present

## 2023-03-14 DIAGNOSIS — E662 Morbid (severe) obesity with alveolar hypoventilation: Secondary | ICD-10-CM | POA: Diagnosis present

## 2023-03-14 DIAGNOSIS — E871 Hypo-osmolality and hyponatremia: Secondary | ICD-10-CM | POA: Diagnosis not present

## 2023-03-14 DIAGNOSIS — I10 Essential (primary) hypertension: Secondary | ICD-10-CM | POA: Insufficient documentation

## 2023-03-14 DIAGNOSIS — Z6841 Body Mass Index (BMI) 40.0 and over, adult: Secondary | ICD-10-CM | POA: Diagnosis not present

## 2023-03-14 DIAGNOSIS — R6520 Severe sepsis without septic shock: Secondary | ICD-10-CM | POA: Diagnosis not present

## 2023-03-14 DIAGNOSIS — G9389 Other specified disorders of brain: Secondary | ICD-10-CM | POA: Diagnosis not present

## 2023-03-14 DIAGNOSIS — E119 Type 2 diabetes mellitus without complications: Secondary | ICD-10-CM | POA: Diagnosis not present

## 2023-03-14 DIAGNOSIS — H6690 Otitis media, unspecified, unspecified ear: Secondary | ICD-10-CM

## 2023-03-14 DIAGNOSIS — G042 Bacterial meningoencephalitis and meningomyelitis, not elsewhere classified: Secondary | ICD-10-CM | POA: Diagnosis not present

## 2023-03-14 DIAGNOSIS — R9431 Abnormal electrocardiogram [ECG] [EKG]: Secondary | ICD-10-CM | POA: Diagnosis not present

## 2023-03-14 DIAGNOSIS — H748X1 Other specified disorders of right middle ear and mastoid: Secondary | ICD-10-CM | POA: Diagnosis not present

## 2023-03-14 DIAGNOSIS — Z7984 Long term (current) use of oral hypoglycemic drugs: Secondary | ICD-10-CM

## 2023-03-14 DIAGNOSIS — Z79899 Other long term (current) drug therapy: Secondary | ICD-10-CM | POA: Diagnosis not present

## 2023-03-14 DIAGNOSIS — R000 Tachycardia, unspecified: Secondary | ICD-10-CM | POA: Diagnosis not present

## 2023-03-14 DIAGNOSIS — R0689 Other abnormalities of breathing: Secondary | ICD-10-CM | POA: Diagnosis not present

## 2023-03-14 DIAGNOSIS — J323 Chronic sphenoidal sinusitis: Secondary | ICD-10-CM | POA: Diagnosis not present

## 2023-03-14 DIAGNOSIS — R404 Transient alteration of awareness: Secondary | ICD-10-CM | POA: Diagnosis not present

## 2023-03-14 DIAGNOSIS — R410 Disorientation, unspecified: Secondary | ICD-10-CM | POA: Diagnosis not present

## 2023-03-14 DIAGNOSIS — W19XXXA Unspecified fall, initial encounter: Secondary | ICD-10-CM | POA: Diagnosis not present

## 2023-03-14 DIAGNOSIS — R739 Hyperglycemia, unspecified: Secondary | ICD-10-CM | POA: Diagnosis not present

## 2023-03-14 LAB — CBC WITH DIFFERENTIAL/PLATELET
Abs Immature Granulocytes: 0.33 10*3/uL — ABNORMAL HIGH (ref 0.00–0.07)
Basophils Absolute: 0 10*3/uL (ref 0.0–0.1)
Basophils Relative: 0 %
Eosinophils Absolute: 0 10*3/uL (ref 0.0–0.5)
Eosinophils Relative: 0 %
HCT: 45.1 % (ref 39.0–52.0)
Hemoglobin: 15.6 g/dL (ref 13.0–17.0)
Immature Granulocytes: 1 %
Lymphocytes Relative: 2 %
Lymphs Abs: 0.4 10*3/uL — ABNORMAL LOW (ref 0.7–4.0)
MCH: 32.1 pg (ref 26.0–34.0)
MCHC: 34.6 g/dL (ref 30.0–36.0)
MCV: 92.8 fL (ref 80.0–100.0)
Monocytes Absolute: 1 10*3/uL (ref 0.1–1.0)
Monocytes Relative: 4 %
Neutro Abs: 22 10*3/uL — ABNORMAL HIGH (ref 1.7–7.7)
Neutrophils Relative %: 93 %
Platelets: 197 10*3/uL (ref 150–400)
RBC: 4.86 MIL/uL (ref 4.22–5.81)
RDW: 12.8 % (ref 11.5–15.5)
WBC: 23.7 10*3/uL — ABNORMAL HIGH (ref 4.0–10.5)
nRBC: 0 % (ref 0.0–0.2)

## 2023-03-14 LAB — PROTIME-INR
INR: 1.1 (ref 0.8–1.2)
Prothrombin Time: 14.8 s (ref 11.4–15.2)

## 2023-03-14 LAB — HEMOGLOBIN A1C
Hgb A1c MFr Bld: 6 % — ABNORMAL HIGH (ref 4.8–5.6)
Mean Plasma Glucose: 125.5 mg/dL

## 2023-03-14 LAB — COMPREHENSIVE METABOLIC PANEL
ALT: 19 U/L (ref 0–44)
AST: 15 U/L (ref 15–41)
Albumin: 3.4 g/dL — ABNORMAL LOW (ref 3.5–5.0)
Alkaline Phosphatase: 69 U/L (ref 38–126)
Anion gap: 14 (ref 5–15)
BUN: 15 mg/dL (ref 6–20)
CO2: 21 mmol/L — ABNORMAL LOW (ref 22–32)
Calcium: 9 mg/dL (ref 8.9–10.3)
Chloride: 107 mmol/L (ref 98–111)
Creatinine, Ser: 1.02 mg/dL (ref 0.61–1.24)
GFR, Estimated: >60 mL/min (ref >60)
Glucose, Bld: 267 mg/dL — ABNORMAL HIGH (ref 70–99)
Potassium: 3.2 mmol/L — ABNORMAL LOW (ref 3.5–5.1)
Sodium: 142 mmol/L (ref 135–145)
Total Bilirubin: 1.2 mg/dL (ref 0.3–1.2)
Total Protein: 7.9 g/dL (ref 6.5–8.1)

## 2023-03-14 LAB — URINALYSIS, ROUTINE W REFLEX MICROSCOPIC
Bilirubin Urine: NEGATIVE
Glucose, UA: >=500 mg/dL — AB
Ketones, ur: 20 mg/dL — AB
Leukocytes,Ua: NEGATIVE
Nitrite: NEGATIVE
Protein, ur: >=300 mg/dL — AB
Specific Gravity, Urine: 1.032 — ABNORMAL HIGH (ref 1.005–1.030)
pH: 5 (ref 5.0–8.0)

## 2023-03-14 LAB — TYPE AND SCREEN
ABO/RH(D): A POS
Antibody Screen: NEGATIVE

## 2023-03-14 LAB — MRSA NEXT GEN BY PCR, NASAL: MRSA by PCR Next Gen: NOT DETECTED

## 2023-03-14 LAB — LACTIC ACID, PLASMA: Lactic Acid, Venous: 1.8 mmol/L (ref 0.5–1.9)

## 2023-03-14 LAB — GLUCOSE, CAPILLARY
Glucose-Capillary: 232 mg/dL — ABNORMAL HIGH (ref 70–99)
Glucose-Capillary: 264 mg/dL — ABNORMAL HIGH (ref 70–99)

## 2023-03-14 LAB — HIV ANTIBODY (ROUTINE TESTING W REFLEX): HIV Screen 4th Generation wRfx: NONREACTIVE

## 2023-03-14 MED ORDER — CHLORHEXIDINE GLUCONATE CLOTH 2 % EX PADS
6.0000 | MEDICATED_PAD | Freq: Every day | CUTANEOUS | Status: DC
  Administered 2023-03-15 – 2023-03-19 (×5): 6 via TOPICAL

## 2023-03-14 MED ORDER — POLYETHYLENE GLYCOL 3350 17 G PO PACK: 17.0000 g | PACK | Freq: Every day | ORAL | Status: DC | PRN

## 2023-03-14 MED ORDER — SODIUM CHLORIDE 0.9 % IV SOLN
2.0000 g | Freq: Three times a day (TID) | INTRAVENOUS | Status: DC
  Administered 2023-03-14 – 2023-03-17 (×8): 2 g via INTRAVENOUS
  Filled 2023-03-14 (×11): qty 40

## 2023-03-14 MED ORDER — ACETAMINOPHEN 325 MG PO TABS: 650.0000 mg | ORAL_TABLET | ORAL | Status: DC | PRN

## 2023-03-14 MED ORDER — DEXAMETHASONE 4 MG PO TABS
10.0000 mg | ORAL_TABLET | Freq: Once | ORAL | Status: AC
  Administered 2023-03-14: 10 mg via ORAL
  Filled 2023-03-14: qty 3

## 2023-03-14 MED ORDER — DEXAMETHASONE SODIUM PHOSPHATE 10 MG/ML IJ SOLN
10.0000 mg | Freq: Four times a day (QID) | INTRAMUSCULAR | Status: AC
  Administered 2023-03-15 – 2023-03-18 (×16): 10 mg via INTRAVENOUS
  Filled 2023-03-14 (×16): qty 1

## 2023-03-14 MED ORDER — ORAL CARE MOUTH RINSE
15.0000 mL | OROMUCOSAL | Status: DC
  Administered 2023-03-15 – 2023-03-19 (×16): 15 mL via OROMUCOSAL

## 2023-03-14 MED ORDER — HYDROMORPHONE HCL 1 MG/ML IJ SOLN
1.0000 mg | Freq: Once | INTRAMUSCULAR | Status: AC
Start: 2023-03-14 — End: 2023-03-14
  Administered 2023-03-14: 1 mg via INTRAMUSCULAR
  Filled 2023-03-14: qty 1

## 2023-03-14 MED ORDER — VANCOMYCIN HCL 1250 MG/250ML IV SOLN
1250.0000 mg | Freq: Two times a day (BID) | INTRAVENOUS | Status: DC
  Administered 2023-03-15 – 2023-03-18 (×7): 1250 mg via INTRAVENOUS
  Filled 2023-03-14 (×8): qty 250

## 2023-03-14 MED ORDER — DOCUSATE SODIUM 100 MG PO CAPS
100.0000 mg | ORAL_CAPSULE | Freq: Two times a day (BID) | ORAL | Status: DC | PRN
  Administered 2023-03-16 – 2023-03-19 (×2): 100 mg via ORAL
  Filled 2023-03-14 (×2): qty 1

## 2023-03-14 MED ORDER — ONDANSETRON HCL 4 MG/2ML IJ SOLN: 4.0000 mg | Freq: Four times a day (QID) | INTRAMUSCULAR | Status: DC | PRN

## 2023-03-14 MED ORDER — OXYCODONE HCL 5 MG PO TABS: 5.0000 mg | ORAL_TABLET | ORAL | 0 refills | Status: DC | PRN

## 2023-03-14 MED ORDER — ORAL CARE MOUTH RINSE: 15.0000 mL | OROMUCOSAL | Status: DC | PRN

## 2023-03-14 MED ORDER — SULFAMETHOXAZOLE-TRIMETHOPRIM 400-80 MG/5ML IV SOLN
450.0000 mg | Freq: Three times a day (TID) | INTRAVENOUS | Status: DC
  Administered 2023-03-15 (×2): 450 mg via INTRAVENOUS
  Filled 2023-03-14 (×5): qty 28.13

## 2023-03-14 MED ORDER — ONDANSETRON 4 MG PO TBDP
ORAL_TABLET | ORAL | Status: AC
  Filled 2023-03-14: qty 1

## 2023-03-14 MED ORDER — INSULIN ASPART 100 UNIT/ML IJ SOLN
0.0000 [IU] | INTRAMUSCULAR | Status: DC
  Administered 2023-03-14: 5 [IU] via SUBCUTANEOUS
  Administered 2023-03-15 (×4): 8 [IU] via SUBCUTANEOUS
  Administered 2023-03-15 – 2023-03-17 (×10): 5 [IU] via SUBCUTANEOUS
  Administered 2023-03-17: 8 [IU] via SUBCUTANEOUS

## 2023-03-14 MED ORDER — SODIUM CHLORIDE 0.9 % IV SOLN: 2.0000 g | INTRAVENOUS | Status: DC

## 2023-03-14 MED ORDER — OXYCODONE HCL 5 MG PO TABS
5.0000 mg | ORAL_TABLET | Freq: Once | ORAL | Status: AC
  Administered 2023-03-14: 5 mg via ORAL
  Filled 2023-03-14: qty 1

## 2023-03-14 NOTE — Progress Notes (Signed)
Responded to order for ML. [place] assessment of both arms reveals size and depth of cephalic and brachial veins are inappropriate for ML access. Basilic veins have a small 1 inch section at the appropriate depth; however, both are near the [facility] area and increase in depth after this. Spoke with DO present during assessment. Agree to plan for US guided IV at this time with recommendation to consider PICC line for long term antibiotic needs.

## 2023-03-14 NOTE — Progress Notes (Signed)
eLink Physician-Brief Progress Note Patient Name: MEIR ELWOOD DOB: [birth year] MRN: [id number]   Date of Service  03/14/2023  HPI/Events of Note  57 year old male with a history of diverticulitis, nephrolithiasis, who presented with right ear pain radiating to the right jaw after recent ear infection having failed outpatient antibiotics.  The patient presented tachypneic, hypertensive, requiring 2 L of oxygen to maintain saturation greater than 90%.  Metabolic panel consistent with anion gap metabolic acidosis, hyperglycemia.  WBC of 21.9 no evidence system lacking only [place] log into this room  CT with right mastoid swelling consistent with acute infection.  eICU Interventions  LP studies are pending via fax from [facility]  Currently running vancomycin, anticipate broad-spectrum antibiotics.  Ground team evaluation pending  Initial basic labs have been sent.  Patient is a difficult stick and has 1 peripheral IV.  Will request midline catheter and perform blood draws at the same time.  No indication for GI prophylaxis, DVT prophylaxis pending.   [facility] called with positive cultures from LP; Gram + Cocci in CSF cultures. Covered with Vanc and Mero  Intervention Category Evaluation Type: New Patient Evaluation  Asher Torpey 03/14/2023, 7:47 PM

## 2023-03-14 NOTE — Progress Notes (Addendum)
Pharmacy Antibiotic Note  Christopher Oliver is a 57 y.o. male admitted on 03/14/2023 with meningitis.  Pharmacy has been consulted for Vanc dosing.  Pt was transferred from [facility] for likely bacterial meningitis. LP was was done there and showed a glucose <6, elevated protein 595, neutrophils 93%. He did get a dose of cetriaxone there. Consider changing Merrem to ceftriaxone + ampicillin.  Scr 1.32  Vanc 2g x1 1756 Ceftriaxone 2g x1 1620  Addendum  Ampicillin was added for listeria coverage per neuro. Merrem would provide some coverage for listeria. Due to his PCN allergy, we will use Septra instead since it has excellent activity for listeria. Plan was discussed with Dr. Gaynell Face.   Plan: Vanc 1.25g IV q12 (VT 15-20) Merrem 2g IV q8 Septra 450mg  IV q8 F/u cultures from [facility] Monitor scr    Temp (24hrs), Avg:100 F (37.8 C), Min:98.8 F (37.1 C), Max:100.9 F (38.3 C)  No results for input(s): "WBC", "CREATININE", "LATICACIDVEN", "VANCOTROUGH", "VANCOPEAK", "VANCORANDOM", "GENTTROUGH", "GENTPEAK", "GENTRANDOM", "TOBRATROUGH", "TOBRAPEAK", "TOBRARND", "AMIKACINPEAK", "AMIKACINTROU", "AMIKACIN" in the last 168 hours.  CrCl cannot be calculated (Patient's most recent lab result is older than the maximum 21 days allowed.).    Allergies  Allergen Reactions   Penicillins Rash    Antimicrobials this admission: 10/4 vanc>> 10/4 merrem>> 10/4 Ceftriaxone 2g x1 1620  10/4 Septra>>  Dose adjustments this admission:   Microbiology results: 10/4 blood [facility] R)>>   10/4 blood>> MRSA neg  Ulyses Southward, PharmD, BCIDP, AAHIVP, CPP Infectious Disease Pharmacist 03/14/2023 9:45 PM

## 2023-03-14 NOTE — ED Notes (Signed)
ED Provider at bedside. 

## 2023-03-14 NOTE — ED Triage Notes (Addendum)
Pt c/o R ear pain since Monday with n/v and imbalance, was seen at [facility] and prescribed Cefdinir, worsening pain, pt reports he has not missed any doses of abx
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
## 2023-03-14 NOTE — ED Notes (Signed)
Patient transported to CT 

## 2023-03-14 NOTE — ED Notes (Signed)
Brought pt in w/c to vehicle, pt stood up rapidly from w/c and started vomiting. Brought pt zofran and cold towel. Offered pt to come back inside, pt wanted to go home. Pt wife driving. Assured he can return at any time if needed. Dr. Pilar Plate made aware.

## 2023-03-14 NOTE — ED Notes (Signed)
Heat pack given.

## 2023-03-14 NOTE — Discharge Instructions (Signed)
You were evaluated in the Emergency Department and after careful evaluation, we did not find any emergent condition requiring admission or further testing in the hospital.  Your exam/testing today is overall reassuring.  Continue the cefdinir antibiotic as prescribed.  Continue the Flonase decongestants.  Tylenol and Motrin at home for discomfort, use the oxycodone for more significant pain.  Please return to the Emergency Department if you experience any worsening of your condition.   Thank you for allowing [place] to be a part of your care.

## 2023-03-15 DIAGNOSIS — G039 Meningitis, unspecified: Secondary | ICD-10-CM

## 2023-03-15 DIAGNOSIS — G9341 Metabolic encephalopathy: Secondary | ICD-10-CM | POA: Diagnosis not present

## 2023-03-15 DIAGNOSIS — G009 Bacterial meningitis, unspecified: Secondary | ICD-10-CM | POA: Diagnosis not present

## 2023-03-15 LAB — CBC
HCT: 44 % (ref 39.0–52.0)
Hemoglobin: 15.1 g/dL (ref 13.0–17.0)
MCH: 31.5 pg (ref 26.0–34.0)
MCHC: 34.3 g/dL (ref 30.0–36.0)
MCV: 91.7 fL (ref 80.0–100.0)
Platelets: 179 10*3/uL (ref 150–400)
RBC: 4.8 MIL/uL (ref 4.22–5.81)
RDW: 12.7 % (ref 11.5–15.5)
WBC: 23.8 10*3/uL — ABNORMAL HIGH (ref 4.0–10.5)
nRBC: 0 % (ref 0.0–0.2)

## 2023-03-15 LAB — BASIC METABOLIC PANEL
Anion gap: 12 (ref 5–15)
BUN: 16 mg/dL (ref 6–20)
CO2: 21 mmol/L — ABNORMAL LOW (ref 22–32)
Calcium: 8.9 mg/dL (ref 8.9–10.3)
Chloride: 107 mmol/L (ref 98–111)
Creatinine, Ser: 1 mg/dL (ref 0.61–1.24)
GFR, Estimated: >60 mL/min (ref >60)
Glucose, Bld: 287 mg/dL — ABNORMAL HIGH (ref 70–99)
Potassium: 3.1 mmol/L — ABNORMAL LOW (ref 3.5–5.1)
Sodium: 140 mmol/L (ref 135–145)

## 2023-03-15 LAB — GLUCOSE, CAPILLARY
Glucose-Capillary: 217 mg/dL — ABNORMAL HIGH (ref 70–99)
Glucose-Capillary: 219 mg/dL — ABNORMAL HIGH (ref 70–99)
Glucose-Capillary: 238 mg/dL — ABNORMAL HIGH (ref 70–99)
Glucose-Capillary: 254 mg/dL — ABNORMAL HIGH (ref 70–99)
Glucose-Capillary: 264 mg/dL — ABNORMAL HIGH (ref 70–99)
Glucose-Capillary: 278 mg/dL — ABNORMAL HIGH (ref 70–99)

## 2023-03-15 LAB — MAGNESIUM: Magnesium: 2.2 mg/dL (ref 1.7–2.4)

## 2023-03-15 LAB — PHOSPHORUS: Phosphorus: 1.2 mg/dL — ABNORMAL LOW (ref 2.5–4.6)

## 2023-03-15 LAB — ABO/RH: ABO/RH(D): A POS

## 2023-03-15 MED ORDER — POTASSIUM CHLORIDE 10 MEQ/100ML IV SOLN
10.0000 meq | INTRAVENOUS | Status: AC
  Administered 2023-03-15 (×6): 10 meq via INTRAVENOUS
  Filled 2023-03-15 (×6): qty 100

## 2023-03-15 MED ORDER — INSULIN GLARGINE-YFGN 100 UNIT/ML ~~LOC~~ SOLN
8.0000 [IU] | Freq: Two times a day (BID) | SUBCUTANEOUS | Status: DC
  Administered 2023-03-15 (×2): 8 [IU] via SUBCUTANEOUS
  Filled 2023-03-15 (×4): qty 0.08

## 2023-03-15 MED ORDER — ACETAMINOPHEN 650 MG RE SUPP
650.0000 mg | RECTAL | Status: DC | PRN
  Administered 2023-03-15 (×2): 650 mg via RECTAL
  Filled 2023-03-15 (×2): qty 1

## 2023-03-15 MED ORDER — INFLUENZA VIRUS VACC SPLIT PF (FLUZONE) 0.5 ML IM SUSY
0.5000 mL | PREFILLED_SYRINGE | INTRAMUSCULAR | Status: AC
  Administered 2023-03-16: 0.5 mL via INTRAMUSCULAR
  Filled 2023-03-15: qty 0.5

## 2023-03-15 MED ORDER — PNEUMOCOCCAL 20-VAL CONJ VACC 0.5 ML IM SUSY
0.5000 mL | PREFILLED_SYRINGE | INTRAMUSCULAR | Status: AC
  Administered 2023-03-16: 0.5 mL via INTRAMUSCULAR
  Filled 2023-03-15: qty 0.5

## 2023-03-15 MED ORDER — DEXMEDETOMIDINE HCL IN NACL 400 MCG/100ML IV SOLN
0.0000 ug/kg/h | INTRAVENOUS | Status: DC
  Administered 2023-03-15: 0.4 ug/kg/h via INTRAVENOUS
  Administered 2023-03-15: 0.6 ug/kg/h via INTRAVENOUS
  Filled 2023-03-15: qty 200

## 2023-03-15 MED ORDER — LACTATED RINGERS IV SOLN: INTRAVENOUS | Status: DC

## 2023-03-15 MED ORDER — LABETALOL HCL 5 MG/ML IV SOLN
10.0000 mg | Freq: Four times a day (QID) | INTRAVENOUS | Status: DC | PRN
  Administered 2023-03-15 – 2023-03-17 (×5): 10 mg via INTRAVENOUS
  Filled 2023-03-15 (×5): qty 4

## 2023-03-15 NOTE — Progress Notes (Signed)
Pt placed back on BiPAP due to O2 desaturation. RN in room.
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
## 2023-03-15 NOTE — Plan of Care (Signed)
  Problem: Education: Goal: Ability to describe self-care measures that may prevent or decrease complications (Diabetes Survival Skills Education) will improve Outcome: Not Progressing Note: Altered mental status, lethargy Goal: Individualized Educational Video(s) Outcome: Not Progressing Note: Altered mental status, lethargy   Problem: Health Behavior/Discharge Planning: Goal: Ability to identify and utilize available resources and services will improve Outcome: Not Progressing Note: Altered mental status, lethargy Goal: Ability to manage health-related needs will improve Outcome: Not Progressing Note: Altered mental status, lethargy   Problem: Metabolic: Goal: Ability to maintain appropriate glucose levels will improve Outcome: Not Progressing Note: Elevated blood glucose levels   Problem: Nutritional: Goal: Maintenance of adequate nutrition will improve Outcome: Not Progressing Note: NPO due to altered mentation and lethargy

## 2023-03-15 NOTE — Progress Notes (Signed)
eLink Physician-Brief Progress Note Patient Name: [name] DOB: [birth year] MRN: [id number]   Date of Service  03/15/2023  HPI/Events of Note  57 year old male that initially presented with acute septic encephalopathy found to have sepsis secondary to bacterial meningitis with nonobstructing hydrocephalus and acute otitis media/mastoiditis.  Given the patient's encephalopathy, concern that the patient was not able to remove the mask appropriately.  Remains agitated, pulling at the mask despite mittens in place.  Unable to tolerate high flow nasal cannula due to hypoxemia and increased work of breathing.  Family at bedside.  eICU Interventions  Maintain BiPAP therapy at current settings  Initiate Precedex infusion  Adequate IV access, but may need midline/PICC catheter depending on clinical course   0108 -urinalysis sent for " cloudy urine "via In-N-Out cath  0126 -out of the blue, appears to be hypotensive to 69/38.  Appears agitated on exam.  Remains on BiPAP with appropriate synchrony.  Will initiate peripheral vasopressin.  Last labetalol was at 1747.  No significant change to level of consciousness.  On ongoing crystalloid infusion.  0158 -persistently hypotensive with norepinephrine at 5 mcg.  Reported blood pressure with MAP of 45.  Observed blood pressure:  On 5 mcg of norepinephrine.  Patient has become progressively more bradycardic throughout the night.  Precedex dosing has gone up.  Remains intermittently agitated. Discontinue Precedex infusion.  Down titrate norepinephrine per order.  Add on as needed Zyprexa.    Intervention Category Intermediate Interventions: Respiratory distress - evaluation and management  [name] 03/15/2023, 8:12 PM

## 2023-03-15 NOTE — Progress Notes (Signed)
[facility] ADULT ICU REPLACEMENT PROTOCOL   The patient does apply for the [facility] Adult ICU Electrolyte Replacment Protocol based on the criteria listed below:   1.Exclusion criteria: TCTS, ECMO, Dialysis, and Myasthenia Gravis patients 2. Is GFR >/= 30 ml/min? Yes.    Patient's GFR today is >60 3. Is SCr </= 2? Yes.   Patient's SCr is 1.00 mg/dL 4. Did SCr increase >/= 0.5 in 24 hours? No. 5.Pt's weight >40kg  Yes.   6. Abnormal electrolyte(s): K+ = 3.1  7. Electrolytes replaced per protocol 8.  Call MD STAT for K+ </= 2.5, Phos </= 1, or Mag </= 1 Physician:  Delia Chimes, eMD  Suzan Slick Ieisha Gao 03/15/2023 3:54 AM
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
## 2023-03-15 NOTE — Progress Notes (Signed)
NAME:  Christopher Oliver, MRN:  [id number], DOB:  May 23, 1966, LOS: 1 ADMISSION DATE:  03/14/2023, CONSULTATION DATE:  03/14/23 REFERRING MD:  [facility], CHIEF COMPLAINT:  AMS   History of Present Illness:  57 yo male admitted to [facility] from [facility] [place] after being found altered with global aphasia. Was reportedly well until about 2000 on 10/3. Pt was seen in ED earlier in morning at [facility] and was given further treatment for otitis media with oral abx and sent home. This afternoon he fell and hit his head and remained altered. EMS was called and he was taken to [facility] where [facility] was done which showed possible dehiscence along the posterior aspect of the petrous temporal bone and complete opacification of the R mastoid air cells. 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 MRI also done with report pending from OSH (awaiting fax as no records sent). He was given steroids, abx. Pt was noted to have fever to 101.2, ams with some vomiting and nausea at home as well.   He was transferred to [facility] for further eval, started on empiric abx for suspected meningitis. CCM has been asked to admit  Pertinent  Medical History  Acute otitis media (started treatment 4-5 days ago) Diverticulitis Htn H/o nephrolithiasis Hyperlipidemia T2dm with hyperglycemia HFpEF (2019)  Significant Hospital Events: Including procedures, antibiotic start and stop dates in addition to other pertinent events   Transferred to [facility]  Interim History / Subjective:  Patient spiked fever with Tmax 100.4 He is encephalopathic   Objective   Blood pressure (!) 176/95, pulse 64, temperature (!) 100.4 F (38 C), temperature source Axillary, resp. rate (!) 32, weight 122.9 kg, SpO2 94%.        Intake/Output Summary (Last 24 hours) at 03/15/2023 0749 Last data filed at 03/15/2023 0600 Gross per 24 hour  Intake 1268.15 ml  Output 600 ml  Net 668.15 ml   Filed Weights   03/14/23 2000 03/15/23  0500  Weight: 122.9 kg 122.9 kg    Examination: General: Critically ill-appearing male, lying on the bed HEENT: [place]/AT, eyes anicteric.  Severely dry mucus membranes Neuro: Lethargic, opens eyes with painful stimuli, not following commands, moving all 4 extremities.  Positive gag and very weak cough Chest: Tachypneic, coarse breath sounds, no wheezes or rhonchi Heart: Regular rate and rhythm, no murmurs or gallops Abdomen: Soft, nontender, nondistended, bowel sounds present Skin: No rash  Labs and images reviewed  Resolved Hospital Problem list     Assessment & Plan:  Acute septic encephalopathy Severe sepsis due to bacterial meningitis, POA Acute nonobstructing hydrocephalus Acute otitis media/acute mastoiditis Uncontrolled hypertension Steroid-induced hyperglycemia Hypokalemia Morbid obesity OHS/OSA  Patient presented with altered mental status, he was diagnosed with otitis media and acute mastoiditis yesterday He had LP done at [facility], will call to get results Avoid sedation Continue broad-spectrum antibiotics, currently on vancomycin, meropenem and Bactrim Continue dexamethasone 10 mg every 6 hours for 3 days Continue droplet precautions Will call infectious disease consult Follow-up cultures CT head showed slight hydrocephalus, likely in the setting of meningitis Continue as needed labetalol every 6 hours to maintain SBP less than 160 Patient hemoglobin A1c is 6 Started on Lantus 8 units twice daily Continue sliding scale insulin with CBG goal 140-180 Diet and exercise counseling as appropriate Will place him on BiPAP for now   Best Practice (right click and "Reselect all SmartList Selections" daily)   Diet/type: NPO DVT prophylaxis: SCD GI prophylaxis: N/A  Lines: N/A Foley:  N/A Code Status:  full code Last date of multidisciplinary goals of care discussion: 10/5: Patient wife was updated at bedside, decision was to continue full scope of  care  Labs   CBC: Recent Labs  Lab 03/14/23 2000 03/15/23 0105  WBC 23.7* 23.8*  NEUTROABS 22.0*  --   HGB 15.6 15.1  HCT 45.1 44.0  MCV 92.8 91.7  PLT 197 179    Basic Metabolic Panel: Recent Labs  Lab 03/14/23 2000 03/15/23 0105  NA 142 140  K 3.2* 3.1*  CL 107 107  CO2 21* 21*  GLUCOSE 267* 287*  BUN 15 16  CREATININE 1.02 1.00  CALCIUM 9.0 8.9  MG  --  2.2   GFR: Estimated Creatinine Clearance: 103.6 mL/min (by C-G formula based on SCr of 1 mg/dL). Recent Labs  Lab 03/14/23 2000 03/14/23 2215 03/15/23 0105  WBC 23.7*  --  23.8*  LATICACIDVEN  --  1.8  --     Liver Function Tests: Recent Labs  Lab 03/14/23 2000  AST 15  ALT 19  ALKPHOS 69  BILITOT 1.2  PROT 7.9  ALBUMIN 3.4*   No results for input(s): "LIPASE", "AMYLASE" in the last 168 hours. No results for input(s): "AMMONIA" in the last 168 hours.  ABG    Component Value Date/Time   TCO2 24 06/05/2020 2211     Coagulation Profile: Recent Labs  Lab 03/14/23 2000  INR 1.1    Cardiac Enzymes: No results for input(s): "CKTOTAL", "CKMB", "CKMBINDEX", "TROPONINI" in the last 168 hours.  HbA1C: Hgb A1c MFr Bld  Date/Time Value Ref Range Status  03/14/2023 08:33 PM 6.0 (H) 4.8 - 5.6 % Final    Comment:    (NOTE) Pre diabetes:          5.7%-6.4%  Diabetes:              >6.4%  Glycemic control for   <7.0% adults with diabetes     CBG: Recent Labs  Lab 03/14/23 2014 03/14/23 2354 03/15/23 0416  GLUCAP 232* 264* 264*    The patient is critically ill due to sepsis due to acute meningitis/acute septic encephalopathy.  Critical care was necessary to treat or prevent imminent or life-threatening deterioration.  Critical care was time spent personally by me on the following activities: development of treatment plan with patient and/or surrogate as well as nursing, discussions with consultants, evaluation of patient's response to treatment, examination of patient, obtaining history  from patient or surrogate, ordering and performing treatments and interventions, ordering and review of laboratory studies, ordering and review of radiographic studies, pulse oximetry, re-evaluation of patient's condition and participation in multidisciplinary rounds.   During this encounter critical care time was devoted to patient care services described in this note for 42 minutes.     Cheri Fowler, MD [facility] See Amion for pager If no response to pager, please call [phone] until 7pm After 7pm, Please call E-link [phone]

## 2023-03-16 DIAGNOSIS — G009 Bacterial meningitis, unspecified: Secondary | ICD-10-CM | POA: Diagnosis not present

## 2023-03-16 DIAGNOSIS — G9341 Metabolic encephalopathy: Secondary | ICD-10-CM | POA: Diagnosis not present

## 2023-03-16 LAB — CBC WITH DIFFERENTIAL/PLATELET
Abs Immature Granulocytes: 0.17 10*3/uL — ABNORMAL HIGH (ref 0.00–0.07)
Basophils Absolute: 0 10*3/uL (ref 0.0–0.1)
Basophils Relative: 0 %
Eosinophils Absolute: 0 10*3/uL (ref 0.0–0.5)
Eosinophils Relative: 0 %
HCT: 43.8 % (ref 39.0–52.0)
Hemoglobin: 15.3 g/dL (ref 13.0–17.0)
Immature Granulocytes: 1 %
Lymphocytes Relative: 3 %
Lymphs Abs: 0.6 10*3/uL — ABNORMAL LOW (ref 0.7–4.0)
MCH: 31.9 pg (ref 26.0–34.0)
MCHC: 34.9 g/dL (ref 30.0–36.0)
MCV: 91.4 fL (ref 80.0–100.0)
Monocytes Absolute: 0.6 10*3/uL (ref 0.1–1.0)
Monocytes Relative: 3 %
Neutro Abs: 19 10*3/uL — ABNORMAL HIGH (ref 1.7–7.7)
Neutrophils Relative %: 93 %
Platelets: 196 10*3/uL (ref 150–400)
RBC: 4.79 MIL/uL (ref 4.22–5.81)
RDW: 12.8 % (ref 11.5–15.5)
WBC: 20.5 10*3/uL — ABNORMAL HIGH (ref 4.0–10.5)
nRBC: 0 % (ref 0.0–0.2)

## 2023-03-16 LAB — GLUCOSE, CAPILLARY
Glucose-Capillary: 246 mg/dL — ABNORMAL HIGH (ref 70–99)
Glucose-Capillary: 234 mg/dL — ABNORMAL HIGH (ref 70–99)
Glucose-Capillary: 218 mg/dL — ABNORMAL HIGH (ref 70–99)
Glucose-Capillary: 220 mg/dL — ABNORMAL HIGH (ref 70–99)
Glucose-Capillary: 207 mg/dL — ABNORMAL HIGH (ref 70–99)

## 2023-03-16 LAB — BASIC METABOLIC PANEL
Anion gap: 10 (ref 5–15)
BUN: 28 mg/dL — ABNORMAL HIGH (ref 6–20)
CO2: 24 mmol/L (ref 22–32)
Calcium: 8.5 mg/dL — ABNORMAL LOW (ref 8.9–10.3)
Chloride: 106 mmol/L (ref 98–111)
Creatinine, Ser: 1.2 mg/dL (ref 0.61–1.24)
GFR, Estimated: >60 mL/min (ref >60)
Glucose, Bld: 222 mg/dL — ABNORMAL HIGH (ref 70–99)
Potassium: 4 mmol/L (ref 3.5–5.1)
Sodium: 140 mmol/L (ref 135–145)

## 2023-03-16 MED ORDER — INSULIN GLARGINE-YFGN 100 UNIT/ML ~~LOC~~ SOLN
12.0000 [IU] | Freq: Two times a day (BID) | SUBCUTANEOUS | Status: DC
  Administered 2023-03-16 (×2): 12 [IU] via SUBCUTANEOUS
  Filled 2023-03-16 (×4): qty 0.12

## 2023-03-16 MED ORDER — NOREPINEPHRINE 4 MG/250ML-% IV SOLN
2.0000 ug/min | INTRAVENOUS | Status: DC
  Administered 2023-03-16: 2 ug/min via INTRAVENOUS
  Filled 2023-03-16: qty 250

## 2023-03-16 MED ORDER — SODIUM CHLORIDE 0.9 % IV SOLN: 250.0000 mL | INTRAVENOUS | Status: DC

## 2023-03-16 MED ORDER — ACETAMINOPHEN 325 MG PO TABS
650.0000 mg | ORAL_TABLET | Freq: Four times a day (QID) | ORAL | Status: DC | PRN
  Administered 2023-03-16 – 2023-03-19 (×5): 650 mg via ORAL
  Filled 2023-03-16 (×5): qty 2

## 2023-03-16 MED ORDER — ENOXAPARIN SODIUM 40 MG/0.4ML IJ SOSY
40.0000 mg | PREFILLED_SYRINGE | Freq: Every day | INTRAMUSCULAR | Status: DC
  Administered 2023-03-16 – 2023-03-17 (×2): 40 mg via SUBCUTANEOUS
  Filled 2023-03-16 (×2): qty 0.4

## 2023-03-16 MED ORDER — OXYCODONE HCL 5 MG PO TABS
5.0000 mg | ORAL_TABLET | ORAL | Status: DC | PRN
  Administered 2023-03-16 – 2023-03-17 (×3): 5 mg via ORAL
  Filled 2023-03-16 (×3): qty 1

## 2023-03-16 MED ORDER — OLANZAPINE 10 MG IM SOLR
5.0000 mg | Freq: Four times a day (QID) | INTRAMUSCULAR | Status: AC | PRN
  Filled 2023-03-16: qty 10

## 2023-03-16 NOTE — Plan of Care (Signed)
  Problem: Coping: Goal: Ability to adjust to condition or change in health will improve Outcome: Progressing   Problem: Skin Integrity: Goal: Risk for impaired skin integrity will decrease Outcome: Progressing   Problem: Education: Goal: Ability to describe self-care measures that may prevent or decrease complications (Diabetes Survival Skills Education) will improve Outcome: Not Progressing

## 2023-03-16 NOTE — Progress Notes (Signed)
NAME:  Christopher Oliver, MRN:  [id number], DOB:  August 31, 1965, LOS: 2 ADMISSION DATE:  03/14/2023, CONSULTATION DATE:  03/14/23 REFERRING MD:  OSH, CHIEF COMPLAINT:  AMS   History of Present Illness:  57 yo male admitted to [facility] from [facility] after being found altered with global aphasia. Was reportedly well until about 2000 on 10/3. Pt was seen in ED earlier in morning at [facility] and was given further treatment for otitis media with oral abx and sent home. This afternoon he fell and hit his head and remained altered. EMS was called and he was taken to [facility] where [facility] was done which showed possible dehiscence along the posterior aspect of the petrous temporal bone and complete opacification of the R mastoid air cells. 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 MRI also done with report pending from OSH (awaiting fax as no records sent). He was given steroids, abx. Pt was noted to have fever to 101.2, ams with some vomiting and nausea at home as well.   He was transferred to [facility] for further eval, started on empiric abx for suspected meningitis. CCM has been asked to admit  Pertinent  Medical History  Acute otitis media (started treatment 4-5 days ago) Diverticulitis Htn H/o nephrolithiasis Hyperlipidemia T2dm with hyperglycemia HFpEF (2019)  Significant Hospital Events: Including procedures, antibiotic start and stop dates in addition to other pertinent events   Transferred to [facility]  Interim History / Subjective:  Patient continued to spike fever, Tmax 100.8 Mental status is much improved today He was agitated overnight requiring Precedex infusion  Objective   Blood pressure (!) 152/79, pulse 65, temperature 99.1 F (37.3 C), temperature source Axillary, resp. rate (!) 21, weight 122.9 kg, SpO2 95%.    FiO2 (%):  [40 %] 40 % PEEP:  [5 cmH20] 5 cmH20 Pressure Support:  [10 cmH20] 10 cmH20   Intake/Output Summary (Last 24 hours) at  03/16/2023 0732 Last data filed at 03/16/2023 0600 Gross per 24 hour  Intake 3836.35 ml  Output 1400 ml  Net 2436.35 ml   Filed Weights   03/14/23 2000 03/15/23 0500  Weight: 122.9 kg 122.9 kg    Examination: General: Acutely ill-appearing morbidly obese male, lying on the bed HEENT: [place]/AT, eyes anicteric.  moist mucus membranes Neuro: Opens eyes with vocal stimuli, disoriented, following simple commands, moving all 4 extremities Chest: Coarse breath sounds, no wheezes or rhonchi Heart: Regular rate and rhythm, no murmurs or gallops Abdomen: Soft, nontender, nondistended, bowel sounds present Skin: No rash  Labs and images reviewed  Resolved Hospital Problem list     Assessment & Plan:  Acute septic encephalopathy, improving Severe sepsis due to bacterial meningitis with GPC, POA Acute nonobstructing hydrocephalus Acute otitis media Hypertension Steroid-induced hyperglycemia Hypokalemia/hypophosphatemia Morbid obesity OHS/OSA  Patient presented with altered mental status, he was diagnosed with otitis media He had LP done at [facility], Gram stain showed GPC, cultures pending Status is better today, he is able to speak, follow commands but disoriented Continue broad-spectrum antibiotics, currently on vancomycin and meropenem per ID recommendations Bactrim was stopped Appreciate ENT consult, recommend continue conservative management Continue dexamethasone 10 mg every 6 hours for total 4 days Continue droplet precautions Follow-up cultures CT head showed slight hydrocephalus, likely in the setting of meningitis Continue as needed labetalol every 6 hours to maintain SBP less than 160 Patient hemoglobin A1c is 6 His blood sugars are not well-controlled Increase Lantus to 12 units twice daily  Continue sliding scale insulin with CBG goal 140-180 Continue aggressive electrolyte replacement Diet and exercise counseling as appropriate BiPAP at night   Best Practice  (right click and "Reselect all SmartList Selections" daily)   Diet/type: NPO bedside speech and swallow evaluation DVT prophylaxis: Subcu Lovenox GI prophylaxis: N/A Lines: N/A Foley:  N/A Code Status:  full code Last date of multidisciplinary goals of care discussion: 10/5: Patient wife was updated at bedside, decision was to continue full scope of care  Labs   CBC: Recent Labs  Lab 03/14/23 2000 03/15/23 0105  WBC 23.7* 23.8*  NEUTROABS 22.0*  --   HGB 15.6 15.1  HCT 45.1 44.0  MCV 92.8 91.7  PLT 197 179    Basic Metabolic Panel: Recent Labs  Lab 03/14/23 2000 03/15/23 0105  NA 142 140  K 3.2* 3.1*  CL 107 107  CO2 21* 21*  GLUCOSE 267* 287*  BUN 15 16  CREATININE 1.02 1.00  CALCIUM 9.0 8.9  MG  --  2.2  PHOS  --  1.2*   GFR: Estimated Creatinine Clearance: 103.6 mL/min (by C-G formula based on SCr of 1 mg/dL). Recent Labs  Lab 03/14/23 2000 03/14/23 2215 03/15/23 0105  WBC 23.7*  --  23.8*  LATICACIDVEN  --  1.8  --     Liver Function Tests: Recent Labs  Lab 03/14/23 2000  AST 15  ALT 19  ALKPHOS 69  BILITOT 1.2  PROT 7.9  ALBUMIN 3.4*   No results for input(s): "LIPASE", "AMYLASE" in the last 168 hours. No results for input(s): "AMMONIA" in the last 168 hours.  ABG    Component Value Date/Time   TCO2 24 06/05/2020 2211     Coagulation Profile: Recent Labs  Lab 03/14/23 2000  INR 1.1    Cardiac Enzymes: No results for input(s): "CKTOTAL", "CKMB", "CKMBINDEX", "TROPONINI" in the last 168 hours.  HbA1C: Hgb A1c MFr Bld  Date/Time Value Ref Range Status  03/14/2023 08:33 PM 6.0 (H) 4.8 - 5.6 % Final    Comment:    (NOTE) Pre diabetes:          5.7%-6.4%  Diabetes:              >6.4%  Glycemic control for   <7.0% adults with diabetes     CBG: Recent Labs  Lab 03/15/23 1602 03/15/23 2012 03/15/23 2354 03/16/23 0357 03/16/23 0715  GLUCAP 238* 217* 219* 246* 234*    The patient is critically ill due to sepsis due  to acute meningitis/acute septic encephalopathy.  Critical care was necessary to treat or prevent imminent or life-threatening deterioration.  Critical care was time spent personally by me on the following activities: development of treatment plan with patient and/or surrogate as well as nursing, discussions with consultants, evaluation of patient's response to treatment, examination of patient, obtaining history from patient or surrogate, ordering and performing treatments and interventions, ordering and review of laboratory studies, ordering and review of radiographic studies, pulse oximetry, re-evaluation of patient's condition and participation in multidisciplinary rounds.   During this encounter critical care time was devoted to patient care services described in this note for 37 minutes.     Cheri Fowler, MD [place] [facility] See Amion for pager If no response to pager, please call [phone] until 7pm After 7pm, Please call E-link [phone]

## 2023-03-16 NOTE — Progress Notes (Signed)
Patient resting comfortable on 2L [place] satting 94%. Patient is not in any distress, and bipap is on standby. Will continue to monitor.

## 2023-03-16 NOTE — Plan of Care (Signed)

## 2023-03-17 DIAGNOSIS — G001 Pneumococcal meningitis: Principal | ICD-10-CM | POA: Diagnosis present

## 2023-03-17 DIAGNOSIS — G009 Bacterial meningitis, unspecified: Secondary | ICD-10-CM | POA: Diagnosis present

## 2023-03-17 DIAGNOSIS — H66009 Acute suppurative otitis media without spontaneous rupture of ear drum, unspecified ear: Secondary | ICD-10-CM

## 2023-03-17 LAB — CBC WITH DIFFERENTIAL/PLATELET
Abs Immature Granulocytes: 0.42 10*3/uL — ABNORMAL HIGH (ref 0.00–0.07)
Basophils Absolute: 0.1 10*3/uL (ref 0.0–0.1)
Basophils Relative: 0 %
Eosinophils Absolute: 0 10*3/uL (ref 0.0–0.5)
Eosinophils Relative: 0 %
HCT: 45 % (ref 39.0–52.0)
Hemoglobin: 15.4 g/dL (ref 13.0–17.0)
Immature Granulocytes: 3 %
Lymphocytes Relative: 4 %
Lymphs Abs: 0.6 10*3/uL — ABNORMAL LOW (ref 0.7–4.0)
MCH: 31.2 pg (ref 26.0–34.0)
MCHC: 34.2 g/dL (ref 30.0–36.0)
MCV: 91.1 fL (ref 80.0–100.0)
Monocytes Absolute: 0.7 10*3/uL (ref 0.1–1.0)
Monocytes Relative: 4 %
Neutro Abs: 14.9 10*3/uL — ABNORMAL HIGH (ref 1.7–7.7)
Neutrophils Relative %: 89 %
Platelets: 197 10*3/uL (ref 150–400)
RBC: 4.94 MIL/uL (ref 4.22–5.81)
RDW: 12.7 % (ref 11.5–15.5)
WBC: 16.6 10*3/uL — ABNORMAL HIGH (ref 4.0–10.5)
nRBC: 0 % (ref 0.0–0.2)

## 2023-03-17 LAB — BASIC METABOLIC PANEL
Anion gap: 15 (ref 5–15)
BUN: 29 mg/dL — ABNORMAL HIGH (ref 6–20)
CO2: 19 mmol/L — ABNORMAL LOW (ref 22–32)
Calcium: 8.2 mg/dL — ABNORMAL LOW (ref 8.9–10.3)
Chloride: 104 mmol/L (ref 98–111)
Creatinine, Ser: 1.04 mg/dL (ref 0.61–1.24)
GFR, Estimated: >60 mL/min (ref >60)
Glucose, Bld: 246 mg/dL — ABNORMAL HIGH (ref 70–99)
Potassium: 4.1 mmol/L (ref 3.5–5.1)
Sodium: 138 mmol/L (ref 135–145)

## 2023-03-17 LAB — GLUCOSE, CAPILLARY
Glucose-Capillary: 239 mg/dL — ABNORMAL HIGH (ref 70–99)
Glucose-Capillary: 246 mg/dL — ABNORMAL HIGH (ref 70–99)
Glucose-Capillary: 248 mg/dL — ABNORMAL HIGH (ref 70–99)
Glucose-Capillary: 248 mg/dL — ABNORMAL HIGH (ref 70–99)
Glucose-Capillary: 264 mg/dL — ABNORMAL HIGH (ref 70–99)
Glucose-Capillary: 288 mg/dL — ABNORMAL HIGH (ref 70–99)

## 2023-03-17 MED ORDER — ENOXAPARIN SODIUM 60 MG/0.6ML IJ SOSY
60.0000 mg | PREFILLED_SYRINGE | Freq: Every day | INTRAMUSCULAR | Status: DC
  Administered 2023-03-18 – 2023-03-19 (×2): 60 mg via SUBCUTANEOUS
  Filled 2023-03-17 (×2): qty 0.6

## 2023-03-17 MED ORDER — INSULIN ASPART 100 UNIT/ML IJ SOLN
0.0000 [IU] | INTRAMUSCULAR | Status: DC
  Administered 2023-03-17 (×2): 7 [IU] via SUBCUTANEOUS
  Administered 2023-03-17: 11 [IU] via SUBCUTANEOUS
  Administered 2023-03-18 – 2023-03-19 (×9): 7 [IU] via SUBCUTANEOUS
  Administered 2023-03-19 (×2): 4 [IU] via SUBCUTANEOUS

## 2023-03-17 MED ORDER — HYDRALAZINE HCL 20 MG/ML IJ SOLN
10.0000 mg | Freq: Four times a day (QID) | INTRAMUSCULAR | Status: DC | PRN
  Administered 2023-03-17 – 2023-03-18 (×3): 10 mg via INTRAVENOUS
  Filled 2023-03-17 (×4): qty 1

## 2023-03-17 MED ORDER — AMLODIPINE BESYLATE 5 MG PO TABS
5.0000 mg | ORAL_TABLET | Freq: Every day | ORAL | Status: DC
  Administered 2023-03-17 – 2023-03-19 (×3): 5 mg via ORAL
  Filled 2023-03-17 (×3): qty 1

## 2023-03-17 MED ORDER — INSULIN GLARGINE-YFGN 100 UNIT/ML ~~LOC~~ SOLN
20.0000 [IU] | Freq: Two times a day (BID) | SUBCUTANEOUS | Status: DC
  Administered 2023-03-17 – 2023-03-19 (×5): 20 [IU] via SUBCUTANEOUS
  Filled 2023-03-17 (×7): qty 0.2

## 2023-03-17 MED ORDER — SODIUM CHLORIDE 0.9 % IV SOLN
2.0000 g | Freq: Two times a day (BID) | INTRAVENOUS | Status: DC
  Administered 2023-03-17 – 2023-03-18 (×3): 2 g via INTRAVENOUS
  Filled 2023-03-17 (×3): qty 20

## 2023-03-17 NOTE — Inpatient Diabetes Management (Signed)
Inpatient Diabetes Program Recommendations  AACE/ADA: New Consensus Statement on Inpatient Glycemic Control (2015)  Target Ranges:  Prepandial:   less than 140 mg/dL      Peak postprandial:   less than 180 mg/dL (1-2 hours)      Critically ill patients:  140 - 180 mg/dL   Lab Results  Component Value Date   GLUCAP 288 (H) 03/17/2023   HGBA1C 6.0 (H) 03/14/2023    Review of Glycemic Control  Latest Reference Range & Units 03/16/23 20:15 03/17/23 00:01 03/17/23 04:00 03/17/23 08:00 03/17/23 10:58  Glucose-Capillary 70 - 99 mg/dL 409 (H) 811 (H) 914 (H) 264 (H) 288 (H)   Diabetes history: DM  Outpatient Diabetes medications:  Mounjaro 12.5 mg weekly  Current orders for Inpatient glycemic control:  Novolog 0-20 units q 4 hours Decadron 10 mg IV q 6 hours Semglee 20 units q 12 hours (10/7)  Inpatient Diabetes Program Recommendations:   CBG's likely increased due to Decadron as A1C indicates excellent control of DM prior to admit. Agree with current orders.  Will follow.   Thanks,  Beryl Meager, RN, BC-ADM Inpatient Diabetes Coordinator Pager [phone]  (8a-5p)

## 2023-03-17 NOTE — Progress Notes (Signed)
eLink Physician-Brief Progress Note Patient Name: Christopher Oliver DOB: [birth year] MRN: [id number]   Date of Service  03/17/2023  HPI/Events of Note  Notified of persistently elevated BP.    eICU Interventions  Placed order for PRN hydralazine 10mg  IV q6H PRN as a second line antihypertensive.  Will follow response.         [name] 03/17/2023, 12:20 AM

## 2023-03-17 NOTE — Progress Notes (Signed)
Subjective: No new complaints   Antibiotics:  Anti-infectives (From admission, onward)    Start     Dose/Rate Route Frequency Ordered Stop   03/17/23 1000  cefTRIAXone (ROCEPHIN) 2 g in sodium chloride 0.9 % 100 mL IVPB        2 g 200 mL/hr over 30 Minutes Intravenous Every 12 hours 03/17/23 0844     03/15/23 0600  vancomycin (VANCOREADY) IVPB 1250 mg/250 mL        1,250 mg 166.7 mL/hr over 90 Minutes Intravenous Every 12 hours 03/14/23 2140     03/15/23 0000  ampicillin (OMNIPEN) 2 g in sodium chloride 0.9 % 100 mL IVPB  Status:  Discontinued        2 g 300 mL/hr over 20 Minutes Intravenous Every 4 hours 03/14/23 2151 03/14/23 2223   03/14/23 2300  sulfamethoxazole-trimethoprim (BACTRIM) 450 mg of trimethoprim in dextrose 5 % 500 mL IVPB  Status:  Discontinued        450 mg of trimethoprim 352.1 mL/hr over 90 Minutes Intravenous Every 8 hours 03/14/23 2223 03/15/23 1212   03/14/23 2200  meropenem (MERREM) 2 g in sodium chloride 0.9 % 100 mL IVPB  Status:  Discontinued        2 g 280 mL/hr over 30 Minutes Intravenous Every 8 hours 03/14/23 2045 03/17/23 0844       Medications: Scheduled Meds:  amLODipine  5 mg Oral Daily   Chlorhexidine Gluconate Cloth  6 each Topical Daily   dexamethasone (DECADRON) injection  10 mg Intravenous Q6H   [START ON 03/18/2023] enoxaparin (LOVENOX) injection  60 mg Subcutaneous Daily   insulin aspart  0-20 Units Subcutaneous Q4H   insulin glargine-yfgn  20 Units Subcutaneous BID   mouth rinse  15 mL Mouth Rinse 4 times per day   Continuous Infusions:  cefTRIAXone (ROCEPHIN)  IV 2 g (03/17/23 0932)   vancomycin 1,250 mg (03/17/23 0602)   PRN Meds:.acetaminophen, acetaminophen, docusate sodium, hydrALAZINE, labetalol, OLANZapine, ondansetron (ZOFRAN) IV, oxyCODONE, polyethylene glycol    Objective: Weight change:   Intake/Output Summary (Last 24 hours) at 03/17/2023 1714 Last data filed at 03/17/2023 1600 Gross per 24 hour   Intake 1543.12 ml  Output 2351 ml  Net -807.88 ml   Blood pressure 131/79, pulse 69, temperature 98.2 F (36.8 C), temperature source Oral, resp. rate 18, weight 122.9 kg, SpO2 95%. Temp:  [98.2 F (36.8 C)-99.9 F (37.7 C)] 98.2 F (36.8 C) (10/07 1617) Pulse Rate:  [58-89] 69 (10/07 1617) Resp:  [13-26] 18 (10/07 1617) BP: (131-187)/(72-107) 131/79 (10/07 1617) SpO2:  [83 %-95 %] 95 % (10/07 1617) Weight:  [122.9 kg] 122.9 kg (10/07 0711)  Physical Exam: Physical Exam Constitutional:      Appearance: He is well-developed.  HENT:     Head: Normocephalic and atraumatic.  Eyes:     Conjunctiva/sclera: Conjunctivae normal.  Cardiovascular:     Rate and Rhythm: Normal rate and regular rhythm.  Pulmonary:     Effort: Pulmonary effort is normal. No respiratory distress.     Breath sounds: No wheezing.  Abdominal:     General: There is no distension.     Palpations: Abdomen is soft.  Musculoskeletal:        General: Normal range of motion.     Cervical back: Normal range of motion and neck supple.  Skin:    General: Skin is warm and dry.     Findings: No erythema or rash.  Neurological:     General: No focal deficit present.     Mental Status: He is alert and oriented to person, place, and time.  Psychiatric:        Mood and Affect: Mood normal.        Behavior: Behavior normal.        Thought Content: Thought content normal.        Judgment: Judgment normal.      CBC:    BMET Recent Labs    03/16/23 1109 03/17/23 0439  NA 140 138  K 4.0 4.1  CL 106 104  CO2 24 19*  GLUCOSE 222* 246*  BUN 28* 29*  CREATININE 1.20 1.04  CALCIUM 8.5* 8.2*     Liver Panel  Recent Labs    03/14/23 2000  PROT 7.9  ALBUMIN 3.4*  AST 15  ALT 19  ALKPHOS 69  BILITOT 1.2       Sedimentation Rate No results for input(s): "ESRSEDRATE" in the last 72 hours. C-Reactive Protein No results for input(s): "CRP" in the last 72 hours.  Micro Results: Recent Results  (from the past 720 hour(s))  MRSA Next Gen by PCR, Nasal     Status: None   Collection Time: 03/14/23  7:48 PM   Specimen: Nasal Mucosa; Nasal Swab  Result Value Ref Range Status   MRSA by PCR Next Gen NOT DETECTED NOT DETECTED Final    Comment: (NOTE) The GeneXpert MRSA Assay (FDA approved for NASAL specimens only), is one component of a comprehensive MRSA colonization surveillance program. It is not intended to diagnose MRSA infection nor to guide or monitor treatment for MRSA infections. Test performance is not FDA approved in patients less than 29 years old. Performed at [facility], [address], [place], [place] [address]   Culture, blood (Routine X 2) w Reflex to ID Panel     Status: None (Preliminary result)   Collection Time: 03/14/23  8:33 PM   Specimen: BLOOD  Result Value Ref Range Status   Specimen Description BLOOD SITE NOT SPECIFIED  Final   Special Requests   Final    BOTTLES DRAWN AEROBIC AND ANAEROBIC Blood Culture adequate volume   Culture   Final    NO GROWTH 3 DAYS Performed at [facility], [address] [place], [place] [address]    Report Status PENDING  Incomplete  Culture, blood (Routine X 2) w Reflex to ID Panel     Status: None (Preliminary result)   Collection Time: 03/14/23  8:38 PM   Specimen: BLOOD  Result Value Ref Range Status   Specimen Description BLOOD SITE NOT SPECIFIED  Final   Special Requests   Final    BOTTLES DRAWN AEROBIC AND ANAEROBIC Blood Culture adequate volume   Culture   Final    NO GROWTH 3 DAYS Performed at [facility], [address] [place], [place] [address]    Report Status PENDING  Incomplete    Studies/Results: No results found.    Assessment/Plan:  INTERVAL HISTORY: Patient grew Streptococcus pneumonia from CSF cultures at [facility]   Principal Problem:   Pneumococcal meningitis Active Problems:   AMS (altered mental status)   Bacterial meningitis    Christopher Oliver is a  57 y.o. male with with recent otitis media that likely was the source of what ended up being pneumococcal meningitis which was diagnosed via lumbar puncture at [facility].  #1 Pneumococcal bacterial meningitis:  Sensitivities  are still pending from [facility].  I am changes antibiotics to ceftriaxone and vancomycin  Dexamethasone  per protocol.  I have personally spent 52 minutes involved in face-to-face and non-face-to-face activities for this patient on the day of the visit. Professional time spent includes the following activities: Preparing to see the patient (review of tests), Obtaining and/or reviewing separately obtained history (admission/discharge record), Performing a medically appropriate examination and/or evaluation , Ordering medications/tests/procedures, referring and communicating with other health care professionals, Documenting clinical information in the EMR, Independently interpreting results (not separately reported), Communicating results to the patient/family/caregiver, Counseling and educating the patient/family/caregiver and Care coordination (not separately reported).      LOS: 3 days   Acey Lav 03/17/2023, 5:14 PM

## 2023-03-17 NOTE — TOC CM/SW Note (Signed)
Transition of Care [facility]) - Inpatient Brief Assessment   Patient Details  Name: Christopher Oliver MRN: [id number] Date of Birth: 1965/10/06  Transition of [facility]) CM/SW Contact:    Mearl Latin, LCSW Phone Number: 03/17/2023, 9:27 AM   Clinical Narrative: Patient admitted from home with spouse and workup for Pneumococcal meningitis on IV antibiotics. No current TOC needs identified at this time but please place consult if needs arise or if IV antibiotics will be needed at discharge in the home.   Transition of Care Asessment: Insurance and Status: Insurance coverage has been reviewed Patient has primary care physician: Yes Home environment has been reviewed: From home Prior level of function:: Independent Prior/Current Home Services: No current home services Social Determinants of Health Reivew: SDOH reviewed no interventions necessary Readmission risk has been reviewed: Yes Transition of care needs: no transition of care needs at this time

## 2023-03-17 NOTE — Progress Notes (Signed)
INFECTIOUS DISEASE ATTENDING ADDENDUM:   Date: 03/17/2023  Patient name: Christopher Oliver  Medical record number: [id number]  Date of birth: 1965/07/17   Patients CSF cultures from [facility] grew pneumococcus S pending  He got rocephin there so no need for carbapenem  I will change him to rocephin and vancomycin pending S    Acey Lav 03/17/2023, 8:42 AM

## 2023-03-17 NOTE — Plan of Care (Signed)
Patient admitted s/p transfer from OSH for meningitis. Upon initial assessment patient was found to have some increased drowsiness but able to answer some orientation questions appropriately. Patient voiding spontaneously, x3 BMS during shift. Patient able to transfer from bed to chair with x2 assist. Able to sit in chair for 2+ hours, tolerated well. PRN Hydralazine given once, BP responded well to interventions. PRN Oxy given once for pain, patient expressed relief. Patient and family updated in plan of care.  Problem: Education: Goal: Ability to describe self-care measures that may prevent or decrease complications (Diabetes Survival Skills Education) will improve Outcome: Progressing Goal: Individualized Educational Video(s) Outcome: Progressing   Problem: Coping: Goal: Ability to adjust to condition or change in health will improve Outcome: Progressing   Problem: Fluid Volume: Goal: Ability to maintain a balanced intake and output will improve Outcome: Progressing   Problem: Health Behavior/Discharge Planning: Goal: Ability to identify and utilize available resources and services will improve Outcome: Progressing Goal: Ability to manage health-related needs will improve Outcome: Progressing   Problem: Metabolic: Goal: Ability to maintain appropriate glucose levels will improve Outcome: Progressing   Problem: Nutritional: Goal: Maintenance of adequate nutrition will improve Outcome: Progressing Goal: Progress toward achieving an optimal weight will improve Outcome: Progressing   Problem: Skin Integrity: Goal: Risk for impaired skin integrity will decrease Outcome: Progressing   Problem: Tissue Perfusion: Goal: Adequacy of tissue perfusion will improve Outcome: Progressing   Problem: Education: Goal: Knowledge of General Education information will improve Description: Including pain rating scale, medication(s)/side effects and non-pharmacologic comfort measures Outcome:  Progressing   Problem: Health Behavior/Discharge Planning: Goal: Ability to manage health-related needs will improve Outcome: Progressing   Problem: Clinical Measurements: Goal: Ability to maintain clinical measurements within normal limits will improve Outcome: Progressing Goal: Will remain free from infection Outcome: Progressing Goal: Diagnostic test results will improve Outcome: Progressing Goal: Respiratory complications will improve Outcome: Progressing Goal: Cardiovascular complication will be avoided Outcome: Progressing   Problem: Activity: Goal: Risk for activity intolerance will decrease Outcome: Progressing   Problem: Nutrition: Goal: Adequate nutrition will be maintained Outcome: Progressing   Problem: Coping: Goal: Level of anxiety will decrease Outcome: Progressing   Problem: Elimination: Goal: Will not experience complications related to bowel motility Outcome: Progressing Goal: Will not experience complications related to urinary retention Outcome: Progressing   Problem: Pain Managment: Goal: General experience of comfort will improve Outcome: Progressing   Problem: Safety: Goal: Ability to remain free from injury will improve Outcome: Progressing   Problem: Skin Integrity: Goal: Risk for impaired skin integrity will decrease Outcome: Progressing

## 2023-03-17 NOTE — Progress Notes (Signed)
NAME:  Christopher Oliver, MRN:  [id number], DOB:  [birth year], LOS: 3 ADMISSION DATE:  03/14/2023, CONSULTATION DATE:  03/14/23 REFERRING MD:  OSH, CHIEF COMPLAINT:  AMS   History of Present Illness:  57 yo male admitted to [facility] from [facility] [place] after being found altered with global aphasia. Was reportedly well until about 2000 on 10/3. Pt was seen in ED earlier in morning at [facility] and was given further treatment for otitis media with oral abx and sent home. 10/4  he fell and hit his head and remained altered. EMS was called and he was taken to [facility] where [facility] was done which showed possible dehiscence along the posterior aspect of the petrous temporal bone and complete opacification of the R mastoid air cells. 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 MRI also done with report pending from OSH (awaiting fax as no records sent). He was given steroids, abx. Pt was noted to have fever to 101.2, ams with some vomiting and nausea at home as well.   He was transferred to [facility] for further eval, started on empiric abx for suspected meningitis. CCM has been asked to admit  Pertinent  Medical History  Acute otitis media (started treatment 4-5 days ago) Diverticulitis Htn H/o nephrolithiasis Hyperlipidemia T2dm with hyperglycemia HFpEF (2019)  Significant Hospital Events: Including procedures, antibiotic start and stop dates in addition to other pertinent events   Transferred to [facility] 10/7>> CSF cultures from [facility] grew pneumococcus >> started on rocephin and vancomycin until sensitivities result  Interim History / Subjective:  T max 99.9 Mental status is much improved  Precedex discontinued BP remained elevated overnight 10/7 >> started on PRN hydralazine 10mg  IV q6H PRN as a second line antihypertensive.  CSF Cx from [facility] + for pneumococcus , Sensitivities are pending, ABX changed to rocephin and vancomycin by ID  until  sensitivities are available  Objective   Blood pressure (!) 161/72, pulse 67, temperature 99.9 F (37.7 C), temperature source Axillary, resp. rate (!) 22, weight 122.9 kg, SpO2 93%.        Intake/Output Summary (Last 24 hours) at 03/17/2023 0917 Last data filed at 03/17/2023 0600 Gross per 24 hour  Intake 2023.12 ml  Output 1901 ml  Net 122.12 ml   Filed Weights   03/14/23 2000 03/15/23 0500 03/16/23 0703  Weight: 122.9 kg 122.9 kg 122.9 kg    Examination: General: Acutely ill-appearing morbidly obese male, lying on the bed, in NAD HEENT: [place]/AT, eyes anicteric.  moist mucus membranes Neuro: Awake and alert to self and wife, MAE x 4, Conversational, but did not know date or city he was in.  Chest: Bilateral chest excursion, Coarse breath sounds, no wheezes or rhonchi, diminished per bases Heart: Regular rate and rhythm, no murmurs or gallops, SR per tele Abdomen: Soft, nontender, nondistended, bowel sounds present, Body mass index is 42.44 kg/m.  Skin: No rash  Labs and images reviewed 10/7 Na 138/ K 4.1/ Cl 104/ CO2 19/ Glucose 246/Creatinine 1.04 ( 1.20) BUN 29/ Gap 15 WBC 16.6( 20.5) HGB 15.4  Resolved Hospital Problem list     Assessment & Plan:  Acute septic encephalopathy in setting of otitis media, bacterial meningitis>> improving Confused about date and location of [facility] 10/7, but could recall this after being told Acute nonobstructing slight hydrocephalus per initial CT Head Plan Frequent re-orientation  Follow up Imaging as needed Frequent neuro checks  Seizure precautions    Severe sepsis due  to bacterial meningitis with GPC, POA LP done and [facility] + for pneumococcus Plan Antibiotics changed to rocephin and vancomycin 10/7 pending sensitivities can narrow once available  Trend Fever / WBC Curve  last lactate 1.8 Continue droplet precautions Continue dexamethasone 10 mg every 6 hours for total 4 days ( Day 2)  Follow up Cx and  sensitivities  Hypertension PRN hydralazine 10mg  IV q6H PRN as a second line antihypertensive 03/17/2023 for increased BP.  Plan Will add home amlodipine 5 mg  back today Will not add back valsartan at this point Continue to monitor Continue prn hydralazine and labetalol  as needed to maintain BP < 160 systolic   Steroid-induced hyperglycemia A1C is 6 Plan Will increase Semglee  insulin coverage to 20 Units BID Will need to decrease once decadron is discontinued CBG Q 4 SSI with CBG goal 140-180  Hypokalemia/hypophosphatemia>> resolved  Plan BMET daily Replete as needed  Maintain mag > 2   Morbid obesity OHS/OSA Wears home BiPAP per wife, and is compliant Plan Continue BiPAP at at bedtime and with sleep Diet and exercise counseling as appropriate  Nutrition  Plan Diabetic Diet Discontinue IV maintenance medications as taking PO's well.  Monitor UO off maintenance fluids   Best Practice (right click and "Reselect all SmartList Selections" daily)   Diet/type:  DVT prophylaxis: Subcu Lovenox>> will increase dose from 40 mg to 60 mg which is recommendation for his weight for prophylaxis GI prophylaxis: N/A Lines: N/A Foley:  N/A Code Status:  full code Last date of multidisciplinary goals of care discussion: 10/7: Patient wife was updated at bedside, decision was to continue full scope of care  Labs   CBC: Recent Labs  Lab 03/14/23 2000 03/15/23 0105 03/16/23 1109 03/17/23 0439  WBC 23.7* 23.8* 20.5* 16.6*  NEUTROABS 22.0*  --  19.0* 14.9*  HGB 15.6 15.1 15.3 15.4  HCT 45.1 44.0 43.8 45.0  MCV 92.8 91.7 91.4 91.1  PLT 197 179 196 197    Basic Metabolic Panel: Recent Labs  Lab 03/14/23 2000 03/15/23 0105 03/16/23 1109 03/17/23 0439  NA 142 140 140 138  K 3.2* 3.1* 4.0 4.1  CL 107 107 106 104  CO2 21* 21* 24 19*  GLUCOSE 267* 287* 222* 246*  BUN 15 16 28* 29*  CREATININE 1.02 1.00 1.20 1.04  CALCIUM 9.0 8.9 8.5* 8.2*  MG  --  2.2  --   --    PHOS  --  1.2*  --   --    GFR: Estimated Creatinine Clearance: 99.6 mL/min (by C-G formula based on SCr of 1.04 mg/dL). Recent Labs  Lab 03/14/23 2000 03/14/23 2215 03/15/23 0105 03/16/23 1109 03/17/23 0439  WBC 23.7*  --  23.8* 20.5* 16.6*  LATICACIDVEN  --  1.8  --   --   --     Liver Function Tests: Recent Labs  Lab 03/14/23 2000  AST 15  ALT 19  ALKPHOS 69  BILITOT 1.2  PROT 7.9  ALBUMIN 3.4*   No results for input(s): "LIPASE", "AMYLASE" in the last 168 hours. No results for input(s): "AMMONIA" in the last 168 hours.  ABG    Component Value Date/Time   TCO2 24 06/05/2020 2211     Coagulation Profile: Recent Labs  Lab 03/14/23 2000  INR 1.1    Cardiac Enzymes: No results for input(s): "CKTOTAL", "CKMB", "CKMBINDEX", "TROPONINI" in the last 168 hours.  HbA1C: Hgb A1c MFr Bld  Date/Time Value Ref Range Status  03/14/2023  08:33 PM 6.0 (H) 4.8 - 5.6 % Final    Comment:    (NOTE) Pre diabetes:          5.7%-6.4%  Diabetes:              >6.4%  Glycemic control for   <7.0% adults with diabetes     CBG: Recent Labs  Lab 03/16/23 1503 03/16/23 2015 03/17/23 0001 03/17/23 0400 03/17/23 0800  GLUCAP 220* 207* 246* 239* 264*   CC APP Time 33 minutes  Bevelyn Ngo, MSN, AGACNP-BC [facility] See Amion for personal pager PCCM on call pager [phone]  03/17/2023 9:17 AM

## 2023-03-18 ENCOUNTER — Inpatient Hospital Stay (HOSPITAL_COMMUNITY): Payer: BC Managed Care – PPO

## 2023-03-18 DIAGNOSIS — R410 Disorientation, unspecified: Secondary | ICD-10-CM | POA: Diagnosis not present

## 2023-03-18 DIAGNOSIS — G001 Pneumococcal meningitis: Secondary | ICD-10-CM | POA: Diagnosis not present

## 2023-03-18 DIAGNOSIS — E119 Type 2 diabetes mellitus without complications: Secondary | ICD-10-CM | POA: Diagnosis not present

## 2023-03-18 DIAGNOSIS — Z880 Allergy status to penicillin: Secondary | ICD-10-CM

## 2023-03-18 LAB — CBC WITH DIFFERENTIAL/PLATELET
Abs Immature Granulocytes: 0.3 10*3/uL — ABNORMAL HIGH (ref 0.00–0.07)
Basophils Absolute: 0 10*3/uL (ref 0.0–0.1)
Basophils Relative: 0 %
Eosinophils Absolute: 0 10*3/uL (ref 0.0–0.5)
Eosinophils Relative: 0 %
HCT: 48.1 % (ref 39.0–52.0)
Hemoglobin: 16.6 g/dL (ref 13.0–17.0)
Immature Granulocytes: 2 %
Lymphocytes Relative: 4 %
Lymphs Abs: 0.5 10*3/uL — ABNORMAL LOW (ref 0.7–4.0)
MCH: 31.9 pg (ref 26.0–34.0)
MCHC: 34.5 g/dL (ref 30.0–36.0)
MCV: 92.3 fL (ref 80.0–100.0)
Monocytes Absolute: 0.5 10*3/uL (ref 0.1–1.0)
Monocytes Relative: 4 %
Neutro Abs: 11.4 10*3/uL — ABNORMAL HIGH (ref 1.7–7.7)
Neutrophils Relative %: 90 %
Platelets: 222 10*3/uL (ref 150–400)
RBC: 5.21 MIL/uL (ref 4.22–5.81)
RDW: 12.4 % (ref 11.5–15.5)
WBC: 12.7 10*3/uL — ABNORMAL HIGH (ref 4.0–10.5)
nRBC: 0 % (ref 0.0–0.2)

## 2023-03-18 LAB — BASIC METABOLIC PANEL
Anion gap: 12 (ref 5–15)
BUN: 28 mg/dL — ABNORMAL HIGH (ref 6–20)
CO2: 23 mmol/L (ref 22–32)
Calcium: 8.3 mg/dL — ABNORMAL LOW (ref 8.9–10.3)
Chloride: 99 mmol/L (ref 98–111)
Creatinine, Ser: 0.86 mg/dL (ref 0.61–1.24)
GFR, Estimated: >60 mL/min (ref >60)
Glucose, Bld: 225 mg/dL — ABNORMAL HIGH (ref 70–99)
Potassium: 4 mmol/L (ref 3.5–5.1)
Sodium: 134 mmol/L — ABNORMAL LOW (ref 135–145)

## 2023-03-18 LAB — GLUCOSE, CAPILLARY
Glucose-Capillary: 241 mg/dL — ABNORMAL HIGH (ref 70–99)
Glucose-Capillary: 205 mg/dL — ABNORMAL HIGH (ref 70–99)
Glucose-Capillary: 231 mg/dL — ABNORMAL HIGH (ref 70–99)
Glucose-Capillary: 211 mg/dL — ABNORMAL HIGH (ref 70–99)
Glucose-Capillary: 202 mg/dL — ABNORMAL HIGH (ref 70–99)

## 2023-03-18 LAB — VANCOMYCIN, TROUGH: Vancomycin Tr: 10 ug/mL — ABNORMAL LOW (ref 15–20)

## 2023-03-18 LAB — MAGNESIUM: Magnesium: 2.5 mg/dL — ABNORMAL HIGH (ref 1.7–2.4)

## 2023-03-18 MED ORDER — PENICILLIN G POTASSIUM 20000000 UNITS IJ SOLR
12.0000 10*6.[IU] | Freq: Two times a day (BID) | INTRAVENOUS | Status: DC
  Administered 2023-03-18 – 2023-03-19 (×2): 12 10*6.[IU] via INTRAVENOUS
  Filled 2023-03-18 (×3): qty 12

## 2023-03-18 MED ORDER — DIPHENHYDRAMINE HCL 50 MG/ML IJ SOLN
25.0000 mg | Freq: Once | INTRAMUSCULAR | Status: DC | PRN
  Filled 2023-03-18: qty 1

## 2023-03-18 MED ORDER — AMOXICILLIN 500 MG PO CAPS
500.0000 mg | ORAL_CAPSULE | Freq: Once | ORAL | Status: AC
  Administered 2023-03-18: 500 mg via ORAL
  Filled 2023-03-18: qty 1

## 2023-03-18 MED ORDER — EPINEPHRINE 0.3 MG/0.3ML IJ SOAJ
0.3000 mg | Freq: Once | INTRAMUSCULAR | Status: DC | PRN
  Filled 2023-03-18: qty 0.3

## 2023-03-18 MED ORDER — VANCOMYCIN HCL 1750 MG/350ML IV SOLN: 1750.0000 mg | Freq: Two times a day (BID) | INTRAVENOUS | Status: DC

## 2023-03-18 NOTE — Progress Notes (Signed)
Pharmacy Antibiotic Note  Christopher Oliver is a 57 y.o. male admitted on 03/14/2023 with meningitis.  Pharmacy has been consulted for Vanc dosing.  Pt was transferred from [facility] for likely bacterial meningitis. LP was was done there and showed a glucose <6, elevated protein 595, neutrophils 93%. CNS culture grew out strep pneumo. Sens pending. ID has narrowed to vanc/ceftriaxone. Vanc trough came back low today.   Scr down to <1  Plan: Increase vanc 1.75g IV q12 (VT 15-20) F/u cultures from [facility]  Weight: 122.9 kg (270 lb 15.1 oz)  Temp (24hrs), Avg:98.6 F (37 C), Min:97.3 F (36.3 C), Max:99.8 F (37.7 C)  Recent Labs  Lab 03/14/23 2000 03/14/23 2215 03/15/23 0105 03/16/23 1109 03/17/23 0439 03/18/23 0530  WBC 23.7*  --  23.8* 20.5* 16.6* 12.7*  CREATININE 1.02  --  1.00 1.20 1.04 0.86  LATICACIDVEN  --  1.8  --   --   --   --   VANCOTROUGH  --   --   --   --   --  10*    Estimated Creatinine Clearance: 120.5 mL/min (by C-G formula based on SCr of 0.86 mg/dL).    Allergies  Allergen Reactions   Metformin Hcl Diarrhea   Penicillin G     Other Reaction(s): as child, whelps   Penicillins Rash    Antimicrobials this admission: 10/4 vanc>> 10/4 Ceftriaxone 2g x1 1620; 10/7 >> 10/4 merrem>>10/7 10/4 septra  Dose adjustments this admission:   Microbiology results: 10/4 blood>>ngtd [facility] CNS>>strep pneumo  Ulyses Southward, PharmD, BCIDP, AAHIVP, CPP Infectious Disease Pharmacist 03/18/2023 7:33 AM

## 2023-03-18 NOTE — Evaluation (Signed)
Physical Therapy Evaluation Patient Details Name: Christopher Oliver MRN: [id number] DOB: 12-31-1965 Today's Date: 03/18/2023  History of Present Illness  57 yo man admitted 10/4 who presents with acute S. Pneumoniae meningitis following with failed outpatient abx therapy. Admitted to ICU for agitation require precedex and close neurologic monitoring due to acute metabolic encephalopathy. PMH: DM2, obesity, HTN  Clinical Impression  Pt admitted with above diagnosis. Pt was able to ambulate however needed mod assist at times due to impulsivity, decr safety awareness and poor coordination of movement. Pt would benefit from post acute rehab > 3 hours day.  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  Will follow acutely.  Pt currently with functional limitations due to the deficits listed below (see PT Problem List). Pt will benefit from acute skilled PT to increase their independence and safety with mobility to allow discharge.           If plan is discharge home, recommend the following: A lot of help with walking and/or transfers;A little help with bathing/dressing/bathroom;Assistance with cooking/housework;Assist for transportation;Help with stairs or ramp for entrance   Can travel by private vehicle        Equipment Recommendations  (bariatric RW)  Recommendations for Other Services  Rehab consult    Functional Status Assessment Patient has had a recent decline in their functional status and demonstrates the ability to make significant improvements in function in a reasonable and predictable amount of time.     Precautions / Restrictions Precautions Precautions: Fall Restrictions Weight Bearing Restrictions: No      Mobility  Bed Mobility Overal bed mobility: Needs Assistance Bed Mobility: Supine to Sit     Supine to sit: Min assist     General bed mobility comments: slow movement with difficulty coordinating getting to eOB.     Transfers Overall transfer level: Needs assistance Equipment used: Rolling walker (2 wheels) Transfers: Sit to/from Stand Sit to Stand: Min assist           General transfer comment: Cues for hand placement. Incr time to rise and steady self once up.  Poor coordination of movement.    Ambulation/Gait Ambulation/Gait assistance: Mod assist Gait Distance (Feet): 150 Feet Assistive device: Rolling walker (2 wheels) Gait Pattern/deviations: Decreased stance time - left, Decreased step length - left, Decreased weight shift to left, Knee flexed in stance - left, Staggering right, Staggering left, Trunk flexed, Ataxic   Gait velocity interpretation: 1.31 - 2.62 ft/sec, indicative of limited community ambulator   General Gait Details: Pt with decr balance several times when ambulating needing mod assist to recover. Pt lacks safety awareness and needed assist multiple times during his walk. Pt losing balance all directions and daughter present and reports that pt did have some issues with left LE PTA.  Pt with poor coordination of movement but overall good strength. Definite need for use of rW today.  Stairs            Wheelchair Mobility     Tilt Bed    Modified Rankin (Stroke Patients Only)       Balance Overall balance assessment: Needs assistance Sitting-balance support: No upper extremity supported, Feet supported Sitting balance-Leahy Scale: Good     Standing balance support: Bilateral upper extremity supported, During functional activity Standing balance-Leahy Scale: Poor Standing balance comment: relies on RW and external support for balance  Pertinent Vitals/Pain Pain Assessment Pain Assessment: No/denies pain    Home Living Family/patient expects to be discharged to:: Private residence Living Arrangements: Spouse/significant other Available Help at Discharge: Family;Available 24 hours/day Type of Home: House Home  Access: Stairs to enter Entrance Stairs-Rails: None Entrance Stairs-Number of Steps: 5   Home Layout: One level Home Equipment: Wheelchair - manual (may need bariatric RW)      Prior Function Prior Level of Function : Driving;Needs assist             Mobility Comments: pt works as Quarry manager, owns roofing business ADLs Comments: B/D self     Extremity/Trunk Assessment   Upper Extremity Assessment Upper Extremity Assessment: Defer to OT evaluation    Lower Extremity Assessment Lower Extremity Assessment: RLE deficits/detail;LLE deficits/detail RLE Deficits / Details: grossly 4/5 RLE Coordination: decreased gross motor LLE Deficits / Details: grossly 4/5 LLE Coordination: decreased gross motor    Cervical / Trunk Assessment Cervical / Trunk Assessment: Normal  Communication   Communication Communication: No apparent difficulties  Cognition Arousal: Alert Behavior During Therapy: Impulsive, Anxious Overall Cognitive Status: Impaired/Different from baseline Area of Impairment: Following commands, Safety/judgement, Awareness, Problem solving                       Following Commands: Follows one step commands consistently Safety/Judgement: Decreased awareness of safety, Decreased awareness of deficits   Problem Solving: Decreased initiation, Difficulty sequencing, Requires verbal cues, Requires tactile cues General Comments: Pt with poor safety awareness overall needing cues and assist for safe mobility.        General Comments General comments (skin integrity, edema, etc.): VSS    Exercises General Exercises - Lower Extremity Ankle Circles/Pumps: AROM, Both, 10 reps, Supine Long Arc Quad: AROM, Both, 10 reps, Seated   Assessment/Plan    PT Assessment Patient needs continued PT services  PT Problem List Decreased activity tolerance;Decreased balance;Decreased mobility;Decreased coordination;Decreased knowledge of use of DME;Decreased safety  awareness;Decreased knowledge of precautions;Cardiopulmonary status limiting activity;Obesity       PT Treatment Interventions DME instruction;Gait training;Functional mobility training;Therapeutic activities;Therapeutic exercise;Balance training;Patient/family education;Stair training    PT Goals (Current goals can be found in the Care Plan section)  Acute Rehab PT Goals Patient Stated Goal: to go home PT Goal Formulation: With patient Time For Goal Achievement: 04/01/23 Potential to Achieve Goals: Good    Frequency Min 1X/week     Co-evaluation               AM-PAC PT "6 Clicks" Mobility  Outcome Measure Help needed turning from your back to your side while in a flat bed without using bedrails?: A Little Help needed moving from lying on your back to sitting on the side of a flat bed without using bedrails?: A Little Help needed moving to and from a bed to a chair (including a wheelchair)?: A Lot Help needed standing up from a chair using your arms (e.g., wheelchair or bedside chair)?: A Lot Help needed to walk in hospital room?: A Lot Help needed climbing 3-5 steps with a railing? : Total 6 Click Score: 13    End of Session Equipment Utilized During Treatment: Gait belt Activity Tolerance: Patient limited by fatigue Patient left: in chair;with call bell/phone within reach;with chair alarm set;with family/visitor present Nurse Communication: Mobility status PT Visit Diagnosis: Unsteadiness on feet (R26.81);Muscle weakness (generalized) (M62.81)    Time: 8413-2440 PT Time Calculation (min) (ACUTE ONLY): 28 min   Charges:   PT Evaluation $  PT Eval Moderate Complexity: 1 Mod PT Treatments $Gait Training: 8-22 mins PT General Charges $$ ACUTE PT VISIT: 1 Visit         Georjean Toya M,PT Acute Rehab Services [phone]   Bevelyn Buckles 03/18/2023, 2:26 PM

## 2023-03-18 NOTE — Progress Notes (Signed)
Pharmacy Antimicrobial Stewardship: Microbiology Update  Called to get preliminary sensitivities for the CSF culture growing Streptococcus pneumoniae. Will discontinue the Vancomycin given susceptible to both penicillin and ceftriaxone.    Media Information  Document Information    Thank you for allowing pharmacy to be a part of this patient's care.  Georgina Pillion, PharmD, BCPS, BCIDP Infectious Diseases Clinical Pharmacist 03/18/2023 10:42 AM   **Pharmacist phone directory can now be found on [email or web address] ([email or web address]).  Listed under [facility].

## 2023-03-18 NOTE — Progress Notes (Signed)
Pharmacy Note- Penicillin Allergy Clarification   ASSESSMENT:  40 YOM who describes a childhood allergy to PCN - which he believes was a rash. He does not recall a severe reaction and is unsure if treatment was given for the reaction. Now with Strep PNA meningitis for which a continuous infusion of penicillin would be the drug of choice. The patient is willing to undergo an oral Amoxicillin challenge.    PEN-FAST Scoring   Five years or less since last reaction 0  Anaphylaxis/Angioedema OR Severe cutaneous adverse reaction  0  Treatment required for reaction  0-1  Total Score 1-2 points - Low risk of positive penicillin allergy test (5%)    Type of intervention:  Amoxicillin Oral Challenge  Impact on therapy:  Penicillin allergy removed -   PLAN: - amoxicillin 500 mg PO x1 - given on 10/8 @ 1330 - Q8min vitals monitoring x 1 hour  - Epi-Pen PRN  - IV diphenhydramine PRN  - plan communicated to primary RN   POST-CHALLENGE FOLLOW-UP:   The patient tolerated the oral Amoxicillin dose without notable issues. Will plan to remove his penicillin allergy and transition to a penicillin 24 million units/day continuous infusion.   Thank you for allowing pharmacy to be a part of this patient's care.  Georgina Pillion, PharmD, BCPS, BCIDP Infectious Diseases Clinical Pharmacist 03/18/2023 3:09 PM   **Pharmacist phone directory can now be found on [email or web address] ([email or web address]).  Listed under [facility].

## 2023-03-18 NOTE — Progress Notes (Signed)
? ?  Inpatient Rehab Admissions Coordinator : ? ?Per therapy recommendations, patient was screened for CIR candidacy by Blue Ruggerio RN MSN.  At this time patient appears to be a potential candidate for CIR. I will place a rehab consult per protocol for full assessment. Please call me with any questions. ? ?Jerriyah Louis RN MSN ?Admissions Coordinator ?[phone] ?  ?

## 2023-03-18 NOTE — Progress Notes (Signed)
[facility] PROGRESS NOTE   Christopher Oliver ZOX:[id number] DOB: 06/27/1965 DOA: 03/14/2023  PCP: Kirby Funk, MD (Inactive)  Brief History: 57 yo man with DM2, obesity, HTN who presents with acute S. Pneumoniae meningitis following episode of AOM with failed outpatient abx therapy. Admitted to ICU for agitation require precedex and close neurologic monitoring.  He was stabilized and then transferred to the floor.  Consultants: Infectious disease.  Critical care medicine.  Procedures: Lumbar puncture at outside facility    Subjective/Interval History: Patient somnolent this morning but easily arousable.  Wife is at the bedside.  Patient mentions that headache is better.  Denies any other complaints at this time.    Assessment/Plan:  Acute bacterial meningitis with toxic encephalopathy in the setting of otitis media/severe sepsis present on admission Underwent LP at outside facility. CT head showed acute nonobstructive hydrocephalus. Patient was started on antibiotics.  Cultures positive for pneumococcus.  ID has been consulted and following.  Patient was changed over to Rocephin and vancomycin yesterday.  Today vancomycin has been discontinued by ID. Patient was also started on dexamethasone which is being continued. Headache seems to be improving.  Noted to be afebrile.  WBC is improving. Neurological status is stable.  Essential hypertension Noted to be on amlodipine.  Monitor blood pressures.  Steroid-induced hyperglycemia in the setting of diabetes mellitus type 2 On glargine insulin currently on SSI.  HbA1c 6.  Hyponatremia Stable.  Hypokalemia Repleted.  Obstructive sleep apnea Continue with CPAP/BiPAP.  Obesity, class III Estimated body mass index is 42.44 kg/m as calculated from the following:   Height as of an earlier encounter on 03/14/23: 5\' 7"  (1.702 m).   Weight as of this encounter: 122.9 kg.   DVT Prophylaxis: Lovenox Code Status: Full  code Family Communication: Discussed with his wife Disposition Plan: PT and OT eval.  Hopefully discharge home when improved     Medications: Scheduled:  amLODipine  5 mg Oral Daily   amoxicillin  500 mg Oral Once   Chlorhexidine Gluconate Cloth  6 each Topical Daily   dexamethasone (DECADRON) injection  10 mg Intravenous Q6H   enoxaparin (LOVENOX) injection  60 mg Subcutaneous Daily   insulin aspart  0-20 Units Subcutaneous Q4H   insulin glargine-yfgn  20 Units Subcutaneous BID   mouth rinse  15 mL Mouth Rinse 4 times per day   Continuous:  cefTRIAXone (ROCEPHIN)  IV 2 g (03/18/23 1029)   WJX:BJYNWGNFAOZHY, acetaminophen, diphenhydrAMINE, docusate sodium, EPINEPHrine, hydrALAZINE, labetalol, ondansetron (ZOFRAN) IV, oxyCODONE, polyethylene glycol  Antibiotics: Anti-infectives (From admission, onward)    Start     Dose/Rate Route Frequency Ordered Stop   03/18/23 1600  vancomycin (VANCOREADY) IVPB 1750 mg/350 mL  Status:  Discontinued        1,750 mg 175 mL/hr over 120 Minutes Intravenous Every 12 hours 03/18/23 0736 03/18/23 0906   03/18/23 1300  amoxicillin (AMOXIL) capsule 500 mg        500 mg Oral  Once 03/18/23 1110     03/17/23 1000  cefTRIAXone (ROCEPHIN) 2 g in sodium chloride 0.9 % 100 mL IVPB        2 g 200 mL/hr over 30 Minutes Intravenous Every 12 hours 03/17/23 0844     03/15/23 0600  vancomycin (VANCOREADY) IVPB 1250 mg/250 mL  Status:  Discontinued        1,250 mg 166.7 mL/hr over 90 Minutes Intravenous Every 12 hours 03/14/23 2140 03/18/23 0736   03/15/23 0000  ampicillin (OMNIPEN) 2 g  in sodium chloride 0.9 % 100 mL IVPB  Status:  Discontinued        2 g 300 mL/hr over 20 Minutes Intravenous Every 4 hours 03/14/23 2151 03/14/23 2223   03/14/23 2300  sulfamethoxazole-trimethoprim (BACTRIM) 450 mg of trimethoprim in dextrose 5 % 500 mL IVPB  Status:  Discontinued        450 mg of trimethoprim 352.1 mL/hr over 90 Minutes Intravenous Every 8 hours 03/14/23 2223  03/15/23 1212   03/14/23 2200  meropenem (MERREM) 2 g in sodium chloride 0.9 % 100 mL IVPB  Status:  Discontinued        2 g 280 mL/hr over 30 Minutes Intravenous Every 8 hours 03/14/23 2045 03/17/23 0844       Objective:  Vital Signs  Vitals:   03/18/23 0105 03/18/23 0434 03/18/23 0752 03/18/23 1020  BP: (!) 168/86 (!) 172/83 (!) 185/83 (!) 161/95  Pulse: 73 63 67 (!) 58  Resp: 18 17  13   Temp: 98.5 F (36.9 C) 98.4 F (36.9 C) 98.1 F (36.7 C)   TempSrc: Oral Oral Oral   SpO2: 95% 95% 94%   Weight:        Intake/Output Summary (Last 24 hours) at 03/18/2023 1135 Last data filed at 03/18/2023 0634 Gross per 24 hour  Intake 855.29 ml  Output 650 ml  Net 205.29 ml   Filed Weights   03/15/23 0500 03/16/23 0703 03/17/23 0711  Weight: 122.9 kg 122.9 kg 122.9 kg    General appearance:  Somnolent but easily arousable.  In no distress Resp: Clear to auscultation bilaterally.  Normal effort Cardio: S1-S2 is normal regular.  No S3-S4.  No rubs murmurs or bruit GI: Abdomen is soft.  Nontender nondistended.  Bowel sounds are present normal.  No masses organomegaly Extremities: No edema.  Full range of motion of lower extremities. Neurologic: No focal neurological deficits.    Lab Results:  Data Reviewed: I have personally reviewed following labs and reports of the imaging studies  CBC: Recent Labs  Lab 03/14/23 2000 03/15/23 0105 03/16/23 1109 03/17/23 0439 03/18/23 0530  WBC 23.7* 23.8* 20.5* 16.6* 12.7*  NEUTROABS 22.0*  --  19.0* 14.9* 11.4*  HGB 15.6 15.1 15.3 15.4 16.6  HCT 45.1 44.0 43.8 45.0 48.1  MCV 92.8 91.7 91.4 91.1 92.3  PLT 197 179 196 197 222    Basic Metabolic Panel: Recent Labs  Lab 03/14/23 2000 03/15/23 0105 03/16/23 1109 03/17/23 0439 03/18/23 0530  NA 142 140 140 138 134*  K 3.2* 3.1* 4.0 4.1 4.0  CL 107 107 106 104 99  CO2 21* 21* 24 19* 23  GLUCOSE 267* 287* 222* 246* 225*  BUN 15 16 28* 29* 28*  CREATININE 1.02 1.00 1.20 1.04  0.86  CALCIUM 9.0 8.9 8.5* 8.2* 8.3*  MG  --  2.2  --   --  2.5*  PHOS  --  1.2*  --   --   --     GFR: Estimated Creatinine Clearance: 120.5 mL/min (by C-G formula based on SCr of 0.86 mg/dL).  Liver Function Tests: Recent Labs  Lab 03/14/23 2000  AST 15  ALT 19  ALKPHOS 69  BILITOT 1.2  PROT 7.9  ALBUMIN 3.4*   Coagulation Profile: Recent Labs  Lab 03/14/23 2000  INR 1.1     CBG: Recent Labs  Lab 03/17/23 1058 03/17/23 1602 03/17/23 2036 03/18/23 0100 03/18/23 0753  GLUCAP 288* 248* 248* 241* 205*     Recent Results (  from the past 240 hour(s))  MRSA Next Gen by PCR, Nasal     Status: None   Collection Time: 03/14/23  7:48 PM   Specimen: Nasal Mucosa; Nasal Swab  Result Value Ref Range Status   MRSA by PCR Next Gen NOT DETECTED NOT DETECTED Final    Comment: (NOTE) The GeneXpert MRSA Assay (FDA approved for NASAL specimens only), is one component of a comprehensive MRSA colonization surveillance program. It is not intended to diagnose MRSA infection nor to guide or monitor treatment for MRSA infections. Test performance is not FDA approved in patients less than 72 years old. Performed at [facility], [address] [place], [place] [address]   Culture, blood (Routine X 2) w Reflex to ID Panel     Status: None (Preliminary result)   Collection Time: 03/14/23  8:33 PM   Specimen: BLOOD  Result Value Ref Range Status   Specimen Description BLOOD SITE NOT SPECIFIED  Final   Special Requests   Final    BOTTLES DRAWN AEROBIC AND ANAEROBIC Blood Culture adequate volume   Culture   Final    NO GROWTH 4 DAYS Performed at [facility], [address], [place], [place] [address]    Report Status PENDING  Incomplete  Culture, blood (Routine X 2) w Reflex to ID Panel     Status: None (Preliminary result)   Collection Time: 03/14/23  8:38 PM   Specimen: BLOOD  Result Value Ref Range Status   Specimen Description BLOOD SITE NOT SPECIFIED  Final    Special Requests   Final    BOTTLES DRAWN AEROBIC AND ANAEROBIC Blood Culture adequate volume   Culture   Final    NO GROWTH 4 DAYS Performed at [facility], [address] [place], [place] [address]    Report Status PENDING  Incomplete      Radiology Studies: DG CHEST PORT 1 VIEW  Result Date: 03/18/2023 CLINICAL DATA:  Aspiration and airway. EXAM: PORTABLE CHEST 1 VIEW COMPARISON:  Chest x-ray 06/01/2008. FINDINGS: Low lung volumes. No consolidation. No visible pleural effusions or pneumothorax. Cardiomediastinal silhouette is accentuated by technique. IMPRESSION: Limited low lung volumes study without evidence of acute cardiopulmonary disease. Electronically Signed   By: Feliberto Harts M.D.   On: 03/18/2023 09:37       LOS: 4 days   Shanese Riemenschneider Rito Ehrlich  [facility] Pager on [email or web address]  03/18/2023, 11:35 AM

## 2023-03-18 NOTE — Inpatient Diabetes Management (Signed)
Inpatient Diabetes Program Recommendations  AACE/ADA: New Consensus Statement on Inpatient Glycemic Control   Target Ranges:  Prepandial:   less than 140 mg/dL      Peak postprandial:   less than 180 mg/dL (1-2 hours)      Critically ill patients:  140 - 180 mg/dL    Latest Reference Range & Units 03/17/23 08:00 03/17/23 10:58 03/17/23 16:02 03/17/23 20:36 03/18/23 01:00 03/18/23 07:53  Glucose-Capillary 70 - 99 mg/dL 161 (H) 096 (H) 045 (H) 248 (H) 241 (H) 205 (H)   Review of Glycemic Control  Diabetes history: DM2 Outpatient Diabetes medications: Mounjaro 12.5 mg Qweek Current orders for Inpatient glycemic control:  Semglee 20 units BID, Novolog 0-20 units Q4H; Decadron 10 mg Q6H  Inpatient Diabetes Program Recommendations:    Insulin: If steroids are continued, please consider increasing Semglee to 25 units BID and Novolog 3 units TID with meals for meal coverage if patient eats at least 50% of meals.  Thanks, Orlando Penner, RN, MSN, CDCES Diabetes Coordinator Inpatient Diabetes Program [phone] (Team Pager from 8am to 5pm)

## 2023-03-18 NOTE — Progress Notes (Signed)
Subjective: No new complaints   Antibiotics:  Anti-infectives (From admission, onward)    Start     Dose/Rate Route Frequency Ordered Stop   03/18/23 1600  vancomycin (VANCOREADY) IVPB 1750 mg/350 mL  Status:  Discontinued        1,750 mg 175 mL/hr over 120 Minutes Intravenous Every 12 hours 03/18/23 0736 03/18/23 0906   03/18/23 1300  amoxicillin (AMOXIL) capsule 500 mg        500 mg Oral  Once 03/18/23 1110 03/18/23 1321   03/17/23 1000  cefTRIAXone (ROCEPHIN) 2 g in sodium chloride 0.9 % 100 mL IVPB        2 g 200 mL/hr over 30 Minutes Intravenous Every 12 hours 03/17/23 0844     03/15/23 0600  vancomycin (VANCOREADY) IVPB 1250 mg/250 mL  Status:  Discontinued        1,250 mg 166.7 mL/hr over 90 Minutes Intravenous Every 12 hours 03/14/23 2140 03/18/23 0736   03/15/23 0000  ampicillin (OMNIPEN) 2 g in sodium chloride 0.9 % 100 mL IVPB  Status:  Discontinued        2 g 300 mL/hr over 20 Minutes Intravenous Every 4 hours 03/14/23 2151 03/14/23 2223   03/14/23 2300  sulfamethoxazole-trimethoprim (BACTRIM) 450 mg of trimethoprim in dextrose 5 % 500 mL IVPB  Status:  Discontinued        450 mg of trimethoprim 352.1 mL/hr over 90 Minutes Intravenous Every 8 hours 03/14/23 2223 03/15/23 1212   03/14/23 2200  meropenem (MERREM) 2 g in sodium chloride 0.9 % 100 mL IVPB  Status:  Discontinued        2 g 280 mL/hr over 30 Minutes Intravenous Every 8 hours 03/14/23 2045 03/17/23 0844       Medications: Scheduled Meds:  amLODipine  5 mg Oral Daily   Chlorhexidine Gluconate Cloth  6 each Topical Daily   dexamethasone (DECADRON) injection  10 mg Intravenous Q6H   enoxaparin (LOVENOX) injection  60 mg Subcutaneous Daily   insulin aspart  0-20 Units Subcutaneous Q4H   insulin glargine-yfgn  20 Units Subcutaneous BID   mouth rinse  15 mL Mouth Rinse 4 times per day   Continuous Infusions:  cefTRIAXone (ROCEPHIN)  IV 2 g (03/18/23 1029)   PRN Meds:.acetaminophen,  acetaminophen, diphenhydrAMINE, docusate sodium, EPINEPHrine, hydrALAZINE, labetalol, ondansetron (ZOFRAN) IV, oxyCODONE, polyethylene glycol    Objective: Weight change: 0 kg  Intake/Output Summary (Last 24 hours) at 03/18/2023 1329 Last data filed at 03/18/2023 1100 Gross per 24 hour  Intake 930.29 ml  Output 450 ml  Net 480.29 ml   Blood pressure (!) 172/94, pulse 62, temperature 98 F (36.7 C), temperature source Oral, resp. rate 13, weight 122.9 kg, SpO2 97%. Temp:  [97.3 F (36.3 C)-98.9 F (37.2 C)] 98 F (36.7 C) (10/08 1250) Pulse Rate:  [58-76] 62 (10/08 1250) Resp:  [13-19] 13 (10/08 1020) BP: (131-187)/(79-104) 172/94 (10/08 1250) SpO2:  [94 %-97 %] 97 % (10/08 1250)  Physical Exam: Physical Exam Constitutional:      Appearance: He is well-developed.  HENT:     Head: Normocephalic and atraumatic.  Eyes:     Conjunctiva/sclera: Conjunctivae normal.  Cardiovascular:     Rate and Rhythm: Normal rate and regular rhythm.  Pulmonary:     Effort: Pulmonary effort is normal. No respiratory distress.     Breath sounds: No wheezing.  Abdominal:     General: There is no distension.  Palpations: Abdomen is soft.  Musculoskeletal:        General: Normal range of motion.     Cervical back: Normal range of motion and neck supple.  Skin:    General: Skin is warm and dry.     Findings: No erythema or rash.  Neurological:     General: No focal deficit present.     Mental Status: He is alert and oriented to person, place, and time.  Psychiatric:        Mood and Affect: Mood normal.        Behavior: Behavior normal.        Thought Content: Thought content normal.        Judgment: Judgment normal.      CBC:    BMET Recent Labs    03/17/23 0439 03/18/23 0530  NA 138 134*  K 4.1 4.0  CL 104 99  CO2 19* 23  GLUCOSE 246* 225*  BUN 29* 28*  CREATININE 1.04 0.86  CALCIUM 8.2* 8.3*     Liver Panel  No results for input(s): "PROT", "ALBUMIN", "AST",  "ALT", "ALKPHOS", "BILITOT", "BILIDIR", "IBILI" in the last 72 hours.      Sedimentation Rate No results for input(s): "ESRSEDRATE" in the last 72 hours. C-Reactive Protein No results for input(s): "CRP" in the last 72 hours.  Micro Results: Recent Results (from the past 720 hour(s))  MRSA Next Gen by PCR, Nasal     Status: None   Collection Time: 03/14/23  7:48 PM   Specimen: Nasal Mucosa; Nasal Swab  Result Value Ref Range Status   MRSA by PCR Next Gen NOT DETECTED NOT DETECTED Final    Comment: (NOTE) The GeneXpert MRSA Assay (FDA approved for NASAL specimens only), is one component of a comprehensive MRSA colonization surveillance program. It is not intended to diagnose MRSA infection nor to guide or monitor treatment for MRSA infections. Test performance is not FDA approved in patients less than 85 years old. Performed at [facility], [address], [place], [place] [address]   Culture, blood (Routine X 2) w Reflex to ID Panel     Status: None (Preliminary result)   Collection Time: 03/14/23  8:33 PM   Specimen: BLOOD  Result Value Ref Range Status   Specimen Description BLOOD SITE NOT SPECIFIED  Final   Special Requests   Final    BOTTLES DRAWN AEROBIC AND ANAEROBIC Blood Culture adequate volume   Culture   Final    NO GROWTH 4 DAYS Performed at [facility], [address], [place], [place] [address]    Report Status PENDING  Incomplete  Culture, blood (Routine X 2) w Reflex to ID Panel     Status: None (Preliminary result)   Collection Time: 03/14/23  8:38 PM   Specimen: BLOOD  Result Value Ref Range Status   Specimen Description BLOOD SITE NOT SPECIFIED  Final   Special Requests   Final    BOTTLES DRAWN AEROBIC AND ANAEROBIC Blood Culture adequate volume   Culture   Final    NO GROWTH 4 DAYS Performed at [facility], [address] [place], [place] [address]    Report Status PENDING  Incomplete    Studies/Results: DG CHEST PORT 1  VIEW  Result Date: 03/18/2023 CLINICAL DATA:  Aspiration and airway. EXAM: PORTABLE CHEST 1 VIEW COMPARISON:  Chest x-ray 06/01/2008. FINDINGS: Low lung volumes. No consolidation. No visible pleural effusions or pneumothorax. Cardiomediastinal silhouette is accentuated  by technique. IMPRESSION: Limited low lung volumes study without evidence of acute cardiopulmonary disease. Electronically Signed   By: Feliberto Harts M.D.   On: 03/18/2023 09:37      Assessment/Plan:  INTERVAL HISTORY: Christopher Oliver was penicillin sensitive  Principal Problem:   Pneumococcal meningitis Active Problems:   AMS (altered mental status)   Bacterial meningitis   Acute suppurative otitis media without spontaneous rupture of ear drum    Christopher Oliver is a 57 y.o. male with with recent otitis media that likely was the source of what ended up being pneumococcal meningitis which was diagnosed via lumbar puncture at [facility].  #1  Pneumococcal bacterial meningitis:  Vancomycin has been discontinued  Will continue ceftriaxone for now.  Will do an amoxicillin challenge if he does well and this we will place him on high-dose penicillin  Will plan on can have him completing 2 weeks of therapy in total enteral antibiotics   #2 PCN allergy as child: amox challenge  #3 Vaccine counseling: recommend prevnar 20 for him  I have personally spent 50 minutes involved in face-to-face and non-face-to-face activities for this patient on the day of the visit. Professional time spent includes the following activities: Preparing to see the patient (review of tests), Obtaining and/or reviewing separately obtained history (admission/discharge record), Performing a medically appropriate examination and/or evaluation , Ordering medications/tests/procedures, referring and communicating with other health care professionals, Documenting clinical information in the EMR, Independently interpreting results (not separately reported),  Communicating results to the patient/family/caregiver, Counseling and educating the patient/family/caregiver and Care coordination (not separately reported).       LOS: 4 days   Acey Lav 03/18/2023, 1:29 PM

## 2023-03-18 NOTE — Plan of Care (Signed)
  Problem: Education: Goal: Ability to describe self-care measures that may prevent or decrease complications (Diabetes Survival Skills Education) will improve Outcome: Progressing   Problem: Education: Goal: Individualized Educational Video(s) Outcome: Progressing   Problem: Coping: Goal: Ability to adjust to condition or change in health will improve Outcome: Progressing

## 2023-03-19 DIAGNOSIS — R410 Disorientation, unspecified: Secondary | ICD-10-CM | POA: Diagnosis not present

## 2023-03-19 DIAGNOSIS — G001 Pneumococcal meningitis: Secondary | ICD-10-CM | POA: Diagnosis not present

## 2023-03-19 DIAGNOSIS — H66009 Acute suppurative otitis media without spontaneous rupture of ear drum, unspecified ear: Secondary | ICD-10-CM | POA: Diagnosis not present

## 2023-03-19 DIAGNOSIS — Z880 Allergy status to penicillin: Secondary | ICD-10-CM

## 2023-03-19 DIAGNOSIS — G009 Bacterial meningitis, unspecified: Secondary | ICD-10-CM | POA: Diagnosis not present

## 2023-03-19 LAB — BASIC METABOLIC PANEL
Anion gap: 10 (ref 5–15)
BUN: 29 mg/dL — ABNORMAL HIGH (ref 6–20)
CO2: 26 mmol/L (ref 22–32)
Calcium: 8.5 mg/dL — ABNORMAL LOW (ref 8.9–10.3)
Chloride: 99 mmol/L (ref 98–111)
Creatinine, Ser: 0.92 mg/dL (ref 0.61–1.24)
GFR, Estimated: >60 mL/min (ref >60)
Glucose, Bld: 234 mg/dL — ABNORMAL HIGH (ref 70–99)
Potassium: 4.1 mmol/L (ref 3.5–5.1)
Sodium: 135 mmol/L (ref 135–145)

## 2023-03-19 LAB — CBC WITH DIFFERENTIAL/PLATELET
Abs Immature Granulocytes: 0.31 10*3/uL — ABNORMAL HIGH (ref 0.00–0.07)
Basophils Absolute: 0 10*3/uL (ref 0.0–0.1)
Basophils Relative: 0 %
Eosinophils Absolute: 0 10*3/uL (ref 0.0–0.5)
Eosinophils Relative: 0 %
HCT: 47.7 % (ref 39.0–52.0)
Hemoglobin: 16.7 g/dL (ref 13.0–17.0)
Immature Granulocytes: 2 %
Lymphocytes Relative: 4 %
Lymphs Abs: 0.6 10*3/uL — ABNORMAL LOW (ref 0.7–4.0)
MCH: 31.7 pg (ref 26.0–34.0)
MCHC: 35 g/dL (ref 30.0–36.0)
MCV: 90.7 fL (ref 80.0–100.0)
Monocytes Absolute: 0.8 10*3/uL (ref 0.1–1.0)
Monocytes Relative: 6 %
Neutro Abs: 11.7 10*3/uL — ABNORMAL HIGH (ref 1.7–7.7)
Neutrophils Relative %: 88 %
Platelets: 244 10*3/uL (ref 150–400)
RBC: 5.26 MIL/uL (ref 4.22–5.81)
RDW: 12 % (ref 11.5–15.5)
WBC: 13.4 10*3/uL — ABNORMAL HIGH (ref 4.0–10.5)
nRBC: 0 % (ref 0.0–0.2)

## 2023-03-19 LAB — GLUCOSE, CAPILLARY
Glucose-Capillary: 203 mg/dL — ABNORMAL HIGH (ref 70–99)
Glucose-Capillary: 232 mg/dL — ABNORMAL HIGH (ref 70–99)
Glucose-Capillary: 204 mg/dL — ABNORMAL HIGH (ref 70–99)
Glucose-Capillary: 162 mg/dL — ABNORMAL HIGH (ref 70–99)
Glucose-Capillary: 172 mg/dL — ABNORMAL HIGH (ref 70–99)

## 2023-03-19 LAB — CULTURE, BLOOD (ROUTINE X 2)
Culture: NO GROWTH
Culture: NO GROWTH
Special Requests: ADEQUATE
Special Requests: ADEQUATE

## 2023-03-19 MED ORDER — SODIUM CHLORIDE 0.9% FLUSH: 10.0000 mL | INTRAVENOUS | Status: DC | PRN

## 2023-03-19 MED ORDER — SODIUM CHLORIDE 0.9% FLUSH
10.0000 mL | Freq: Two times a day (BID) | INTRAVENOUS | Status: DC
  Administered 2023-03-19: 10 mL

## 2023-03-19 MED ORDER — PENICILLIN G POTASSIUM IV (FOR PTA / DISCHARGE USE ONLY): INTRAVENOUS | 0 refills | Status: AC

## 2023-03-19 MED ORDER — ATORVASTATIN CALCIUM 40 MG PO TABS
40.0000 mg | ORAL_TABLET | Freq: Every day | ORAL | Status: DC
  Administered 2023-03-19: 40 mg via ORAL
  Filled 2023-03-19: qty 1

## 2023-03-19 MED ORDER — ALLOPURINOL 300 MG PO TABS
300.0000 mg | ORAL_TABLET | Freq: Every day | ORAL | Status: DC
  Administered 2023-03-19: 300 mg via ORAL
  Filled 2023-03-19: qty 1

## 2023-03-19 MED ORDER — CALCIUM CARBONATE ANTACID 500 MG PO CHEW: 1.0000 | CHEWABLE_TABLET | Freq: Two times a day (BID) | ORAL | 0 refills | Status: AC | PRN

## 2023-03-19 MED ORDER — CALCIUM CARBONATE ANTACID 500 MG PO CHEW
1.0000 | CHEWABLE_TABLET | Freq: Two times a day (BID) | ORAL | Status: DC | PRN
  Administered 2023-03-19 (×2): 200 mg via ORAL
  Filled 2023-03-19 (×2): qty 1

## 2023-03-19 MED ORDER — IRBESARTAN 300 MG PO TABS
300.0000 mg | ORAL_TABLET | Freq: Every day | ORAL | Status: DC
  Administered 2023-03-19: 300 mg via ORAL
  Filled 2023-03-19: qty 1

## 2023-03-19 MED ORDER — PANTOPRAZOLE SODIUM 40 MG PO TBEC: 40.0000 mg | DELAYED_RELEASE_TABLET | Freq: Every day | ORAL | 0 refills | Status: AC

## 2023-03-19 NOTE — Evaluation (Signed)
Occupational Therapy Evaluation Patient Details Name: Christopher Oliver MRN: [id number] DOB: Oct 30, 1965 Today's Date: 03/19/2023   History of Present Illness 57 yo man admitted 10/4 who presents with acute S. Pneumoniae meningitis following with failed outpatient abx therapy. Admitted to ICU for agitation require precedex and close neurologic monitoring due to acute metabolic encephalopathy. PMH: DM2, obesity, HTN   Clinical Impression   PTA, pt was living at home with his family, pt was independent with ADL/IADL and functional mobility. 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  Pt currently requires close contact guard assistance with functional mobility at RW level. Pt demonstrates cognitive limitations below baseline functioning (see cognition section). Given how independent pt was PTA, recommend intensive inpatient follow up therapy, >3 hours/day to maximize. Will continue to follow and continue to assess and update d/c recommendation as appropriate.        If plan is discharge home, recommend the following: A little help with walking and/or transfers;A little help with bathing/dressing/bathroom    Functional Status Assessment  Patient has had a recent decline in their functional status and demonstrates the ability to make significant improvements in function in a reasonable and predictable amount of time.  Equipment Recommendations  Other (comment) (tbd)    Recommendations for Other Services       Precautions / Restrictions Precautions Precautions: Fall Restrictions Weight Bearing Restrictions: No      Mobility Bed Mobility Overal bed mobility: Needs Assistance Bed Mobility: Supine to Sit     Supine to sit: Supervision     General bed mobility comments: increased time and effort to progress to EOB    Transfers Overall transfer level: Needs assistance Equipment used: Rolling walker (2 wheels) Transfers: Sit to/from Stand Sit to Stand: Contact guard assist            General transfer comment: cues for safe hand placement, increased time and effort, requiring increased momentum      Balance Overall balance assessment: Needs assistance Sitting-balance support: No upper extremity supported, Feet supported Sitting balance-Leahy Scale: Good     Standing balance support: Bilateral upper extremity supported, During functional activity Standing balance-Leahy Scale: Poor Standing balance comment: relies on RW and external support for balance                           ADL either performed or assessed with clinical judgement   ADL Overall ADL's : Needs assistance/impaired Eating/Feeding: Set up;Sitting   Grooming: Contact guard assist;Standing   Upper Body Bathing: Set up;Sitting   Lower Body Bathing: Contact guard assist;Sit to/from stand   Upper Body Dressing : Set up;Sitting   Lower Body Dressing: Contact guard assist;Sit to/from stand Lower Body Dressing Details (indicate cue type and reason): required increased time and effort to don briefs while seated EOB Toilet Transfer: Contact guard assist;Ambulation Statistician Details (indicate cue type and reason): pt had BM on commode Toileting- Clothing Manipulation and Hygiene: Contact guard assist;Sit to/from stand       Functional mobility during ADLs: Contact guard assist;Rolling walker (2 wheels) General ADL Comments: pt limited by instability, decreased activity tolerance, and cognition     Vision         Perception         Praxis         Pertinent Vitals/Pain Pain Assessment Pain Assessment: No/denies pain     Extremity/Trunk Assessment Upper Extremity Assessment Upper Extremity Assessment: Overall WFL for tasks assessed  Lower Extremity Assessment Lower Extremity Assessment: Defer to PT evaluation RLE Deficits / Details: grossly 4/5 RLE Coordination: decreased gross motor LLE Deficits / Details: grossly 4/5 LLE Coordination: decreased gross motor    Cervical / Trunk Assessment Cervical / Trunk Assessment: Normal   Communication Communication Communication: No apparent difficulties   Cognition Arousal: Alert Behavior During Therapy: Impulsive, WFL for tasks assessed/performed Overall Cognitive Status: Impaired/Different from baseline Area of Impairment: Following commands, Safety/judgement, Awareness, Problem solving, Memory, Attention, Orientation                 Orientation Level: Disoriented to, Time Current Attention Level: Sustained Memory: Decreased short-term memory Following Commands: Follows one step commands consistently, Follows one step commands with increased time Safety/Judgement: Decreased awareness of safety, Decreased awareness of deficits Awareness: Emergent Problem Solving: Decreased initiation, Difficulty sequencing, Requires verbal cues, Requires tactile cues General Comments: Pt with poor safety awareness overall needing cues and assist for safe mobility. stated month was september. decreased stm requiring frequent cues for safe handplacement. pt stated time was 6pm. he scored an 18/28 on the short blessed test. Pt took >13min to state months of the year in reverse order.     General Comments  vss on ra    Exercises     Shoulder Instructions      Home Living Family/patient expects to be discharged to:: Private residence Living Arrangements: Spouse/significant other Available Help at Discharge: Family;Available 24 hours/day Type of Home: House Home Access: Stairs to enter [employer] of Steps: 5 Entrance Stairs-Rails: None Home Layout: One level     Bathroom Shower/Tub: Producer, television/film/video: Standard     Home Equipment: Wheelchair - manual (may need bariatric RW)          Prior Functioning/Environment Prior Level of Function : Driving;Independent/Modified Independent             Mobility Comments: pt works as Quarry manager, owns roofing business ADLs Comments:  independent        OT Problem List: Decreased activity tolerance;Impaired balance (sitting and/or standing);Decreased cognition;Decreased safety awareness;Decreased knowledge of use of DME or AE      OT Treatment/Interventions: Self-care/ADL training;Therapeutic exercise;Energy conservation;DME and/or AE instruction;Therapeutic activities;Patient/family education;Balance training;Cognitive remediation/compensation    OT Goals(Current goals can be found in the care plan section) Acute Rehab OT Goals Patient Stated Goal: to go home OT Goal Formulation: With patient Time For Goal Achievement: 04/02/23 Potential to Achieve Goals: Good ADL Goals Pt Will Perform Grooming: with modified independence;standing Pt Will Transfer to Toilet: with modified independence;ambulating Additional ADL Goal #1: Pt will complete multistep cog task <3 errors. Additional ADL Goal #2: Pt will demonstrate anticipatory awareness during ADL/IADL and functional mobility.  OT Frequency: Min 3X/week    Co-evaluation              AM-PAC OT "6 Clicks" Daily Activity     Outcome Measure Help from another person eating meals?: A Little Help from another person taking care of personal grooming?: A Little Help from another person toileting, which includes using toliet, bedpan, or urinal?: A Little Help from another person bathing (including washing, rinsing, drying)?: A Little Help from another person to put on and taking off regular upper body clothing?: A Little Help from another person to put on and taking off regular lower body clothing?: A Little 6 Click Score: 18   End of Session Equipment Utilized During Treatment: Rolling walker (2 wheels) Nurse Communication: Mobility status  Activity Tolerance: Patient  tolerated treatment well Patient left: in bed;with call bell/phone within reach;with bed alarm set;with family/visitor present  OT Visit Diagnosis: Other abnormalities of gait and mobility  (R26.89);Muscle weakness (generalized) (M62.81);Other symptoms and signs involving cognitive function                Time: 1101-1144 OT Time Calculation (min): 43 min Charges:  OT General Charges $OT Visit: 1 Visit OT Evaluation $OT Eval Moderate Complexity: 1 Mod OT Treatments $Self Care/Home Management : 8-22 mins $Cognitive Funtion inital: Initial 15 mins  Rosey Bath OTR/L Acute Rehabilitation Services Office: [phone]   [place] Crosby 03/19/2023, 1:05 PM

## 2023-03-19 NOTE — TOC Transition Note (Signed)
Transition of Care [facility]) - CM/SW Discharge Note   Patient Details  Name: Christopher Oliver MRN: [id number] Date of Birth: 06-14-1965  Transition of [facility]) CM/SW Contact:  Janae Bridgeman, RN Phone Number: 03/19/2023, 3:51 PM   Clinical Narrative:    CM met with the patient and family at the bedside to discuss TOc needs.  The patient has declined CIR and wants to return home with IV antibiotics and home health PT.  Home health choice was offered at the bedside and patient did not have a preference.  The patient's wife and daughter Editor, commissioning at [facility]) are available at the bedside for IV infusion teaching.  Patient has Left arm PICC line in place.  [facility] will provide the home health RN and Jeri Modena, RNCM will provide teaching at the bedside for IV infusion and delivery of the IV infusion medication to the home tonight at 7 pm.  MD is fine with infusion being discontinued tonight at 7 pm with discharge and hooking the infusion back up in the home at 9 pm when it arrives.  Ave Filter, RN with [facility] is aware.  I called [facility] and patient was accepted for home health PT.  Home health order placed to be co-signed by the MD.  Patient has RW at the home.  Patient's family will provide transportation to home via car tonight around 7 pm.   Final next level of care: Home w Home Health Services Barriers to Discharge: No Barriers Identified   Patient Goals and CMS Choice CMS Medicare.gov Compare Post Acute Care list provided to:: Patient Choice offered to / list presented to : Patient  Discharge Placement                         Discharge Plan and Services Additional resources added to the After Visit Summary for     Discharge Planning Services: CM Consult Post Acute Care Choice: Home Health          DME Arranged: IV pump/equipment (Continuous IV infusion to be delivered to the home tonight at 9 pm by [employer])   Date DME Agency Contacted: 03/19/23 Time DME Agency  Contacted: [id number] Representative spoke with at DME Agency: [facility] IV infusion company HH Arranged: RN, PT HH Agency: [facility] Date [facility] Agency Contacted: 03/19/23 Time HH Agency Contacted: [phone] Representative spoke with at [facility] Agency: [facility] to provide [facility] PT, [facility] to provide RN  Social Determinants of Health (SDOH) Interventions SDOH Screenings   Food Insecurity: Patient Unable To Answer (03/14/2023)  Housing: Low Risk  (03/14/2023)  Transportation Needs: Patient Unable To Answer (03/14/2023)  Utilities: Patient Unable To Answer (03/14/2023)  Social Connections: Unknown (03/14/2023)   Received from [facility]  Tobacco Use: Low Risk  (03/14/2023)   Received from [facility]     Readmission Risk Interventions    03/19/2023    3:51 PM  Readmission Risk Prevention Plan  Post Dischage Appt Complete  Medication Screening Complete  Transportation Screening Complete

## 2023-03-19 NOTE — Progress Notes (Signed)
Ok to resume home allopurinol/atorvastatin/valsartan per Dr. Clide Dales.  Ulyses Southward, PharmD, BCIDP, AAHIVP, CPP Infectious Disease Pharmacist 03/19/2023 9:36 AM

## 2023-03-19 NOTE — Discharge Summary (Signed)
Physician Discharge Summary   Patient: Christopher Oliver MRN: [id number] DOB: 08-22-1965  Admit date:     03/14/2023  Discharge date: 03/19/23  Discharge Physician: Marcelino Duster   PCP: Kirby Funk, MD (Inactive)   Recommendations at discharge:   PCP follow-up in 1 week. Complete antibiotic therapy penicillin G until 03/28/2023.  Discharge Diagnoses: Principal Problem:   Pneumococcal meningitis Active Problems:   AMS (altered mental status)   Bacterial meningitis   Acute suppurative otitis media without spontaneous rupture of ear drum   Penicillin allergy  Resolved Problems:   * No resolved hospital problems. *  Hospital Course: 57 yo male admitted to [facility] from [facility] after being found altered with global aphasia. Was reportedly well until about 2000 on 10/3. Pt was seen in ED earlier in morning at [facility] and was given further treatment for otitis media with oral abx and sent home. This afternoon he fell and hit his head and remained altered. EMS was called and he was taken to [facility] where [facility] was done which showed possible dehiscence along the posterior aspect of the petrous temporal bone and complete opacification of the R mastoid air cells. 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 MRI also done with report pending from OSH. He was given steroids, abx. Pt was noted to have fever to 101.2, ams with some vomiting and nausea at home as well.    He was transferred to [facility] for further eval, started on empiric abx for suspected meningitis. Admitted to ICU for agitation required precedex and close neurologic monitoring.  ENT evaluated him advised exam and imaging are not consistent with acute mastoiditis.  ID evaluated him advised to continue empiric treatment for meningitis.  He was stabilized and then transferred to the floor.  Lumbar puncture done at outside facility positive for pneumococcus. Evaluated by ID who recommended  Rocephin therapy and stop vancomycin, Decadron. Patient was given amoxicillin challenge with the plan to place him on high-dose penicillin therapy for 2 weeks.  Patient did tolerate amoxicillin well, started on penicillin G infusion.  Patient got PICC line placed, home health set up for antibiotic infusions. PT/ OT evaluated him advised inpatient rehab which he refused. Home health set up. Education regarding IV infusions, PICC care given to family.  He is hemodynamically stable to be discharged home.  Advised him to follow-up with primary care physician upon discharge as instructed.  Patient and family understand and agree with the discharge plan.       Consultants: ENT, ID Procedures performed: PICC line Disposition: Home Diet recommendation:  Discharge Diet Orders (From admission, onward)     Start     Ordered   03/19/23 0000  Diet - low sodium heart healthy        03/19/23 1543           Cardiac diet DISCHARGE MEDICATION: Allergies as of 03/19/2023       Reactions   Metformin Hcl Diarrhea        Medication List     STOP taking these medications    cefdinir 300 MG capsule Commonly known as: OMNICEF   naproxen sodium 220 MG tablet Commonly known as: ALEVE       TAKE these medications    allopurinol 300 MG tablet Commonly known as: ZYLOPRIM Take 300 mg by mouth daily.   amLODipine-valsartan 5-320 MG tablet Commonly known as: EXFORGE Take 1 tablet by mouth daily.   atorvastatin 40 MG tablet  Commonly known as: LIPITOR Take 40 mg by mouth daily.   Mounjaro 12.5 MG/0.5ML Pen Generic drug: tirzepatide Inject 12.5 mg into the skin once a week.   multivitamin Tabs tablet Take 1 tablet by mouth daily.   penicillin G IVPB Give Penicillin G - 24 million units daily by continuous infusion Indication:  Streptococcus pneumoniae meningitis First Dose: Yes Last Day of Therapy:  03/28/2023 Labs - Once weekly:  CBC/D and BMP, Labs - Once weekly: ESR and  CRP Method of administration: Elastomeric (Continuous infusion) Method of administration may be changed at the discretion of home infusion pharmacist based upon assessment of the patient and/or caregiver's ability to self-administer the medication ordered.               Discharge Care Instructions  (From admission, onward)           Start     Ordered   03/19/23 0000  Change dressing on IV access line weekly and PRN  (Home infusion instructions - [facility] )        03/19/23 1543            Follow-up Information     [facility] Follow up.   Specialty: Home Health Services Why: [facility] will providing home health PT.  They will call you in the next 24-48 hours to set up services. Contact information: [address] [place] [address] [phone]         [facility] Follow up.   Why: [facility] will provide home health RN and IV antibiotic coordination.               Discharge Exam: Filed Weights   03/16/23 0703 03/17/23 0711 03/19/23 0519  Weight: 122.9 kg 122.9 kg 122.9 kg   General - Middle aged morbidly obese Caucasian male, no apparent distress HEENT - PERRLA, EOMI, atraumatic head, non tender sinuses. Lung - Clear, rales, rhonchi, wheezes. Heart - S1, S2 heard, no murmurs, rubs, trace pedal edema Neuro - Alert, awake and oriented x 3, non focal exam. Skin - Warm and dry.  Condition at discharge: stable  The results of significant diagnostics from this hospitalization (including imaging, microbiology, ancillary and laboratory) are listed below for reference.   Imaging Studies: DG CHEST PORT 1 VIEW  Result Date: 03/18/2023 CLINICAL DATA:  Aspiration and airway. EXAM: PORTABLE CHEST 1 VIEW COMPARISON:  Chest x-ray 06/01/2008. FINDINGS: Low lung volumes. No consolidation. No visible pleural effusions or pneumothorax. Cardiomediastinal silhouette is accentuated by technique. IMPRESSION: Limited low lung volumes  study without evidence of acute cardiopulmonary disease. Electronically Signed   By: Feliberto Harts M.D.   On: 03/18/2023 09:37   CT HEAD WO CONTRAST ( )  Result Date: 03/14/2023 CLINICAL DATA:  Headache.  Right hemotympanum. EXAM: CT HEAD WITHOUT CONTRAST TECHNIQUE: Contiguous axial images were obtained from the base of the skull through the vertex without intravenous contrast. RADIATION DOSE REDUCTION: This exam was performed according to the departmental dose-optimization program which includes automated exposure control, adjustment of the mA and/or kV according to patient size and/or use of iterative reconstruction technique. COMPARISON:  06/05/2020 FINDINGS: Brain: There is no mass, hemorrhage or extra-axial collection. The size and configuration of the ventricles and extra-axial CSF spaces are normal. The brain parenchyma is normal, without acute or chronic infarction. Vascular: No abnormal hyperdensity of the major intracranial arteries or dural venous sinuses. No intracranial atherosclerosis. Skull: The visualized skull base, calvarium and extracranial soft tissues are normal.  Sinuses/Orbits: Progression of right mastoid opacification, now with opacification of the right middle ear cavity. There is partial opacification of the right sphenoid sinus, unchanged the orbits are normal. IMPRESSION: 1. No acute intracranial abnormality. 2. Progression of right mastoid opacification, now with opacification of the right middle ear cavity. 3. No temporal bone fracture. Electronically Signed   By: Deatra Robinson M.D.   On: 03/14/2023 02:58    Microbiology: Results for orders placed or performed during the hospital encounter of 03/14/23  MRSA Next Gen by PCR, Nasal     Status: None   Collection Time: 03/14/23  7:48 PM   Specimen: Nasal Mucosa; Nasal Swab  Result Value Ref Range Status   MRSA by PCR Next Gen NOT DETECTED NOT DETECTED Final    Comment: (NOTE) The GeneXpert MRSA Assay (FDA approved for  NASAL specimens only), is one component of a comprehensive MRSA colonization surveillance program. It is not intended to diagnose MRSA infection nor to guide or monitor treatment for MRSA infections. Test performance is not FDA approved in patients less than 81 years old. Performed at [facility], [address], [place], [place] [address]   Culture, blood (Routine X 2) w Reflex to ID Panel     Status: None   Collection Time: 03/14/23  8:33 PM   Specimen: BLOOD  Result Value Ref Range Status   Specimen Description BLOOD SITE NOT SPECIFIED  Final   Special Requests   Final    BOTTLES DRAWN AEROBIC AND ANAEROBIC Blood Culture adequate volume   Culture   Final    NO GROWTH 5 DAYS Performed at [facility], [address] [place], [place] [address]    Report Status 03/19/2023 FINAL  Final  Culture, blood (Routine X 2) w Reflex to ID Panel     Status: None   Collection Time: 03/14/23  8:38 PM   Specimen: BLOOD  Result Value Ref Range Status   Specimen Description BLOOD SITE NOT SPECIFIED  Final   Special Requests   Final    BOTTLES DRAWN AEROBIC AND ANAEROBIC Blood Culture adequate volume   Culture   Final    NO GROWTH 5 DAYS Performed at [facility], [address] [place], [place] [address]    Report Status 03/19/2023 FINAL  Final    Labs: CBC: Recent Labs  Lab 03/14/23 2000 03/15/23 0105 03/16/23 1109 03/17/23 0439 03/18/23 0530 03/19/23 0534  WBC 23.7* 23.8* 20.5* 16.6* 12.7* 13.4*  NEUTROABS 22.0*  --  19.0* 14.9* 11.4* 11.7*  HGB 15.6 15.1 15.3 15.4 16.6 16.7  HCT 45.1 44.0 43.8 45.0 48.1 47.7  MCV 92.8 91.7 91.4 91.1 92.3 90.7  PLT 197 179 196 197 222 244   Basic Metabolic Panel: Recent Labs  Lab 03/15/23 0105 03/16/23 1109 03/17/23 0439 03/18/23 0530 03/19/23 0534  NA 140 140 138 134* 135  K 3.1* 4.0 4.1 4.0 4.1  CL 107 106 104 99 99  CO2 21* 24 19* 23 26  GLUCOSE 287* 222* 246* 225* 234*  BUN 16 28* 29* 28* 29*  CREATININE  1.00 1.20 1.04 0.86 0.92  CALCIUM 8.9 8.5* 8.2* 8.3* 8.5*  MG 2.2  --   --  2.5*  --   PHOS 1.2*  --   --   --   --    Liver Function Tests: Recent Labs  Lab 03/14/23 2000  AST 15  ALT 19  ALKPHOS 69  BILITOT 1.2  PROT 7.9  ALBUMIN 3.4*   CBG: Recent Labs  Lab 03/18/23 2018 03/19/23 0055 03/19/23 0401 03/19/23 0818 03/19/23 1432  GLUCAP 202* 203* 232* 204* 162*    Discharge time spent: 41 minutes.  Signed: Marcelino Duster, MD [facility] 03/19/2023

## 2023-03-19 NOTE — Progress Notes (Signed)
Physical Therapy Treatment Patient Details Name: Christopher Oliver MRN: [id number] DOB: Aug 27, 1965 Today's Date: 03/19/2023   History of Present Illness 57 yo man admitted 10/4 who presents with acute S. Pneumoniae meningitis following with failed outpatient abx therapy. Admitted to ICU for agitation require precedex and close neurologic monitoring due to acute metabolic encephalopathy. PMH: DM2, obesity, HTN    PT Comments  Pt greeted resting in bed and agreeable to session with continued progress towards acute goals.  Pt able to progress gait distance with grossly CGA for safety with RW for support with no overt LOB noted. Pt with come drift to L in hall, however pt able to self correct and avoid all obstacles. Pt able to ascend/descend steps in stairwell with up to min A to steady on descent and cues for optimal hand placement.  Pt continues to require cues for sequencing and safety throughout mobility with fair carryover throughput session. Pt was educated on continued walker use to maximize functional independence, safety, and decrease risk for falls as well as appropriate activity progression. Pt continues to benefit from skilled PT services to progress toward functional mobility goals.      If plan is discharge home, recommend the following: A lot of help with walking and/or transfers;A little help with bathing/dressing/bathroom;Assistance with cooking/housework;Assist for transportation;Help with stairs or ramp for entrance   Can travel by private vehicle        Equipment Recommendations       Recommendations for Other Services       Precautions / Restrictions Precautions Precautions: Fall Restrictions Weight Bearing Restrictions: No     Mobility  Bed Mobility Overal bed mobility: Needs Assistance Bed Mobility: Supine to Sit     Supine to sit: Min assist     General bed mobility comments: increased time and effort to progress to EOB with light min A without rail use to  simulate home    Transfers Overall transfer level: Needs assistance Equipment used: Rolling walker (2 wheels) Transfers: Sit to/from Stand Sit to Stand: Contact guard assist           General transfer comment: cues for safe hand placement, increased time and effort, requiring increased momentum    Ambulation/Gait Ambulation/Gait assistance: Contact guard assist, Supervision Gait Distance (Feet): 400 Feet Assistive device: Rolling walker (2 wheels) Gait Pattern/deviations: Decreased stance time - left, Decreased step length - left, Decreased weight shift to left, Knee flexed in stance - left, Staggering right, Staggering left, Trunk flexed, Ataxic Gait velocity: decr     General Gait Details: some L drift with pt able to self correct and avoid obstacles, no overt LOB, noted external rotation of LLE with pt stating chronic   Stairs Stairs: Yes Stairs assistance: Min assist, Contact guard assist Stair Management: Two rails, Step to pattern, Forwards Number of Stairs: 3 General stair comments: cues for step to pattern for safety, min A to steady on descent as pt attempting with single UE support on rail, educated on importance of utilizing both rails   Wheelchair Mobility     Tilt Bed    Modified Rankin (Stroke Patients Only)       Balance Overall balance assessment: Needs assistance Sitting-balance support: No upper extremity supported, Feet supported Sitting balance-Leahy Scale: Good     Standing balance support: Bilateral upper extremity supported, During functional activity Standing balance-Leahy Scale: Poor Standing balance comment: relies on RW  Cognition Arousal: Alert Behavior During Therapy: Impulsive, WFL for tasks assessed/performed Overall Cognitive Status: Impaired/Different from baseline Area of Impairment: Following commands, Safety/judgement, Awareness, Problem solving, Memory, Attention, Orientation                  Orientation Level: Disoriented to, Time Current Attention Level: Sustained Memory: Decreased short-term memory Following Commands: Follows one step commands consistently, Follows one step commands with increased time Safety/Judgement: Decreased awareness of safety, Decreased awareness of deficits Awareness: Emergent Problem Solving: Decreased initiation, Difficulty sequencing, Requires verbal cues, Requires tactile cues General Comments: Pt with poor safety awareness overall needing cues and assist for safe mobility.        Exercises      General Comments General comments (skin integrity, edema, etc.): VSS on RA, pt with multiple family memebers present      Pertinent Vitals/Pain Pain Assessment Pain Assessment: No/denies pain    Home Living                          Prior Function            PT Goals (current goals can now be found in the care plan section) Acute Rehab PT Goals PT Goal Formulation: With patient Time For Goal Achievement: 04/01/23 Progress towards PT goals: Progressing toward goals    Frequency    Min 1X/week      PT Plan      Co-evaluation              AM-PAC PT "6 Clicks" Mobility   Outcome Measure  Help needed turning from your back to your side while in a flat bed without using bedrails?: A Little Help needed moving from lying on your back to sitting on the side of a flat bed without using bedrails?: A Little Help needed moving to and from a bed to a chair (including a wheelchair)?: A Little Help needed standing up from a chair using your arms (e.g., wheelchair or bedside chair)?: A Little Help needed to walk in hospital room?: A Little Help needed climbing 3-5 steps with a railing? : A Little 6 Click Score: 18    End of Session Equipment Utilized During Treatment: Gait belt Activity Tolerance: Patient tolerated treatment well Patient left: in bed;with call bell/phone within reach;with family/visitor  present Nurse Communication: Mobility status PT Visit Diagnosis: Unsteadiness on feet (R26.81);Muscle weakness (generalized) (M62.81)     Time: 8295-6213 PT Time Calculation (min) (ACUTE ONLY): 31 min  Charges:    $Gait Training: 23-37 mins PT General Charges $$ ACUTE PT VISIT: 1 Visit                     Marcus Schwandt R. PTA Acute Rehabilitation Services Office: [phone]   Catalina Antigua 03/19/2023, 4:26 PM

## 2023-03-19 NOTE — Progress Notes (Signed)
  Inpatient Rehabilitation Admissions Coordinator   Met with patient and wife at bedside for rehab assessment. We discussed goals and expectations of a possible CIR admit. They prefer direct discharge home with OP follow up. They report working with OT today and mobilizing in room to bathroom. I will follow his progress to assist in determining rehab venue options. Please call me with any questions.   Ottie Glazier, RN, MSN Rehab Admissions Coordinator [phone]

## 2023-03-19 NOTE — Progress Notes (Signed)
TRH night cross cover note:   I was notified by RN of the patient's request for medication for heartburn.  I subsequently placed order for prn Tums for indigestion/heartburn.     Newton Pigg, DO Hospitalist

## 2023-03-19 NOTE — Progress Notes (Signed)
PHARMACY CONSULT NOTE FOR:  OUTPATIENT  PARENTERAL ANTIBIOTIC THERAPY (OPAT)  Indication: streptococcus pneumoniae meningitis  Regimen: Penicillin G 24 million units daily given as a continuous infusion End date: 03/28/2023  IV antibiotic discharge orders are pended. To discharging provider:  please sign these orders via discharge navigator,  Select New Orders & click on the button choice - Manage This Unsigned Work.     Thank you for allowing pharmacy to be a part of this patient's care.  Christopher Oliver 03/19/2023, 3:11 PM

## 2023-03-19 NOTE — Progress Notes (Signed)
[facility]  Date of Admission:  03/14/2023      Total days of antibiotics           ASSESSMENT: Christopher Oliver is a 57 y.o. male admitted   Pneumococcal Bacterial Meningitis -  -Continue penicillin for 2 weeks - EOT 10/18  H/O Childhood PCN Allergy -  -tolerated amoxicillin challenge and IV PCN now w/o issue.  -Don't think his hiccoughs are related   Hiccoughs -  -started o/n after complaints of heartburn - may consider cxr to ensure no pulmonary component that would cause diaphragm irritation but most likely r/t heartburn.  -d/w Dr. Clide Dales and will defer further work up / management to him.    PLAN: OPAT as outlined below Hiccoughs per Dr. Clide Dales No need for ID follow up   ID will sign off - please call back with any questions/concerns or if we can be of further assistance.    OPAT ORDERS:  Diagnosis: community acquired bacterial meningitis   Culture Result: strep pneumo (PCN MIC < 0.06, CTX MIC < 0.06)  Allergies  Allergen Reactions   Metformin Hcl Diarrhea     Discharge antibiotics to be given via PICC line:  PCN continuous infusion 24 million units Q24h    Duration: 2 weeks   End Date: 03/28/2023  [facility] Care Per Protocol with Biopatch Use: Home health RN for IV administration and teaching, line care and labs.    Labs weekly while on IV antibiotics: _x_ CBC with differential __ BMP **TWICE WEEKLY ON VANCOMYCIN  _x_ CMP __ CRP __ ESR __ Vancomycin trough TWICE WEEKLY __ CK  _x_ Please pull PIC at completion of IV antibiotics __ Please leave PIC in place until doctor has seen patient or been notified  Fax weekly labs to [phone]  Clinic Follow Up Appt: Primary care provider  No ID follow up needed     Principal Problem:   Pneumococcal meningitis Active Problems:   AMS (altered mental status)   Bacterial meningitis   Acute suppurative otitis media without spontaneous rupture of ear drum    Penicillin allergy    allopurinol  300 mg Oral Daily   amLODipine  5 mg Oral Daily   atorvastatin  40 mg Oral Daily   Chlorhexidine Gluconate Cloth  6 each Topical Daily   enoxaparin (LOVENOX) injection  60 mg Subcutaneous Daily   insulin aspart  0-20 Units Subcutaneous Q4H   insulin glargine-yfgn  20 Units Subcutaneous BID   irbesartan  300 mg Oral Daily   mouth rinse  15 mL Mouth Rinse 4 times per day   sodium chloride flush  10-40 mL Intracatheter Q12H   sodium chloride flush  10-40 mL Intracatheter Q12H    SUBJECTIVE: Started having trouble with hiccoughs and heart burn o/n. Headache pain is improved but not 100% resolved yet.   Review of Systems: Review of Systems  Constitutional:  Negative for chills and fever.  Musculoskeletal:  Negative for neck pain.  Neurological:  Positive for headaches.    Allergies  Allergen Reactions   Metformin Hcl Diarrhea    OBJECTIVE: Vitals:   03/19/23 0456 03/19/23 0519 03/19/23 0816 03/19/23 1201  BP: (!) 164/91  (!) 166/92 (!) 143/87  Pulse: 61  (!) 54 71  Resp:    16  Temp: 98.4 F (36.9 C)  97.8 F (36.6 C) 98 F (36.7 C)  TempSrc: Axillary     SpO2: 96%  94% 97%  Weight:  122.9 kg     Body mass index is 42.44 kg/m.  Physical Exam Vitals reviewed.  Constitutional:      Appearance: Normal appearance. He is not ill-appearing.  Cardiovascular:     Rate and Rhythm: Normal rate and regular rhythm.     Heart sounds: No murmur heard. Pulmonary:     Effort: Pulmonary effort is normal.     Breath sounds: Normal breath sounds.  Abdominal:     General: Bowel sounds are normal. There is no distension.     Palpations: Abdomen is soft.  Skin:    General: Skin is warm and dry.     Findings: No rash.  Neurological:     Mental Status: He is alert.     Lab Results Lab Results  Component Value Date   WBC 13.4 (H) 03/19/2023   HGB 16.7 03/19/2023   HCT 47.7 03/19/2023   MCV 90.7 03/19/2023   PLT 244 03/19/2023    Lab  Results  Component Value Date   CREATININE 0.92 03/19/2023   BUN 29 (H) 03/19/2023   NA 135 03/19/2023   K 4.1 03/19/2023   CL 99 03/19/2023   CO2 26 03/19/2023    Lab Results  Component Value Date   ALT 19 03/14/2023   AST 15 03/14/2023   ALKPHOS 69 03/14/2023   BILITOT 1.2 03/14/2023     Microbiology: Recent Results (from the past 240 hour(s))  MRSA Next Gen by PCR, Nasal     Status: None   Collection Time: 03/14/23  7:48 PM   Specimen: Nasal Mucosa; Nasal Swab  Result Value Ref Range Status   MRSA by PCR Next Gen NOT DETECTED NOT DETECTED Final    Comment: (NOTE) The GeneXpert MRSA Assay (FDA approved for NASAL specimens only), is one component of a comprehensive MRSA colonization surveillance program. It is not intended to diagnose MRSA infection nor to guide or monitor treatment for MRSA infections. Test performance is not FDA approved in patients less than 74 years old. Performed at [facility], [address], [place], [place] [address]   Culture, blood (Routine X 2) w Reflex to ID Panel     Status: None   Collection Time: 03/14/23  8:33 PM   Specimen: BLOOD  Result Value Ref Range Status   Specimen Description BLOOD SITE NOT SPECIFIED  Final   Special Requests   Final    BOTTLES DRAWN AEROBIC AND ANAEROBIC Blood Culture adequate volume   Culture   Final    NO GROWTH 5 DAYS Performed at [facility], [address] [place], [place] [address]    Report Status 03/19/2023 FINAL  Final  Culture, blood (Routine X 2) w Reflex to ID Panel     Status: None   Collection Time: 03/14/23  8:38 PM   Specimen: BLOOD  Result Value Ref Range Status   Specimen Description BLOOD SITE NOT SPECIFIED  Final   Special Requests   Final    BOTTLES DRAWN AEROBIC AND ANAEROBIC Blood Culture adequate volume   Culture   Final    NO GROWTH 5 DAYS Performed at [facility], [address] [place], [place] [address]    Report Status 03/19/2023 FINAL  Final      Rexene Alberts, MSN, NP-C [facility]  [email or web address] Pager: [phone] Office: [phone] [facility] Main Line: [phone] *Secure Chat Communication Welcome

## 2023-03-19 NOTE — Plan of Care (Signed)

## 2023-03-20 DIAGNOSIS — G009 Bacterial meningitis, unspecified: Secondary | ICD-10-CM | POA: Diagnosis not present

## 2023-03-21 DIAGNOSIS — G009 Bacterial meningitis, unspecified: Secondary | ICD-10-CM | POA: Diagnosis not present

## 2023-03-22 DIAGNOSIS — G009 Bacterial meningitis, unspecified: Secondary | ICD-10-CM | POA: Diagnosis not present

## 2023-03-23 DIAGNOSIS — G009 Bacterial meningitis, unspecified: Secondary | ICD-10-CM | POA: Diagnosis not present

## 2023-03-24 DIAGNOSIS — G009 Bacterial meningitis, unspecified: Secondary | ICD-10-CM | POA: Diagnosis not present

## 2023-03-25 DIAGNOSIS — Z9181 History of falling: Secondary | ICD-10-CM | POA: Diagnosis not present

## 2023-03-25 DIAGNOSIS — R6520 Severe sepsis without septic shock: Secondary | ICD-10-CM | POA: Diagnosis not present

## 2023-03-25 DIAGNOSIS — Z7985 Long-term (current) use of injectable non-insulin antidiabetic drugs: Secondary | ICD-10-CM | POA: Diagnosis not present

## 2023-03-25 DIAGNOSIS — E871 Hypo-osmolality and hyponatremia: Secondary | ICD-10-CM | POA: Diagnosis not present

## 2023-03-25 DIAGNOSIS — G001 Pneumococcal meningitis: Secondary | ICD-10-CM | POA: Diagnosis not present

## 2023-03-25 DIAGNOSIS — Z6841 Body Mass Index (BMI) 40.0 and over, adult: Secondary | ICD-10-CM | POA: Diagnosis not present

## 2023-03-25 DIAGNOSIS — E66813 Obesity, class 3: Secondary | ICD-10-CM | POA: Diagnosis not present

## 2023-03-25 DIAGNOSIS — G4733 Obstructive sleep apnea (adult) (pediatric): Secondary | ICD-10-CM | POA: Diagnosis not present

## 2023-03-25 DIAGNOSIS — E119 Type 2 diabetes mellitus without complications: Secondary | ICD-10-CM | POA: Diagnosis not present

## 2023-03-25 DIAGNOSIS — G002 Streptococcal meningitis: Secondary | ICD-10-CM | POA: Diagnosis not present

## 2023-03-25 DIAGNOSIS — I10 Essential (primary) hypertension: Secondary | ICD-10-CM | POA: Diagnosis not present

## 2023-03-25 DIAGNOSIS — G009 Bacterial meningitis, unspecified: Secondary | ICD-10-CM | POA: Diagnosis not present

## 2023-03-25 DIAGNOSIS — E785 Hyperlipidemia, unspecified: Secondary | ICD-10-CM | POA: Diagnosis not present

## 2023-03-25 DIAGNOSIS — Z452 Encounter for adjustment and management of vascular access device: Secondary | ICD-10-CM | POA: Diagnosis not present

## 2023-03-25 DIAGNOSIS — E876 Hypokalemia: Secondary | ICD-10-CM | POA: Diagnosis not present

## 2023-03-25 DIAGNOSIS — G929 Unspecified toxic encephalopathy: Secondary | ICD-10-CM | POA: Diagnosis not present

## 2023-03-25 DIAGNOSIS — Z792 Long term (current) use of antibiotics: Secondary | ICD-10-CM | POA: Diagnosis not present

## 2023-03-26 DIAGNOSIS — G009 Bacterial meningitis, unspecified: Secondary | ICD-10-CM | POA: Diagnosis not present

## 2023-03-27 DIAGNOSIS — I10 Essential (primary) hypertension: Secondary | ICD-10-CM | POA: Diagnosis not present

## 2023-03-27 DIAGNOSIS — D72828 Other elevated white blood cell count: Secondary | ICD-10-CM | POA: Diagnosis not present

## 2023-03-27 DIAGNOSIS — Z8661 Personal history of infections of the central nervous system: Secondary | ICD-10-CM | POA: Diagnosis not present

## 2023-03-27 DIAGNOSIS — G009 Bacterial meningitis, unspecified: Secondary | ICD-10-CM | POA: Diagnosis not present

## 2023-03-27 DIAGNOSIS — E1165 Type 2 diabetes mellitus with hyperglycemia: Secondary | ICD-10-CM | POA: Diagnosis not present

## 2023-03-28 DIAGNOSIS — G009 Bacterial meningitis, unspecified: Secondary | ICD-10-CM | POA: Diagnosis not present

## 2023-04-04 DIAGNOSIS — Z452 Encounter for adjustment and management of vascular access device: Secondary | ICD-10-CM | POA: Diagnosis not present

## 2023-04-04 DIAGNOSIS — Z6841 Body Mass Index (BMI) 40.0 and over, adult: Secondary | ICD-10-CM | POA: Diagnosis not present

## 2023-04-04 DIAGNOSIS — E119 Type 2 diabetes mellitus without complications: Secondary | ICD-10-CM | POA: Diagnosis not present

## 2023-04-04 DIAGNOSIS — G929 Unspecified toxic encephalopathy: Secondary | ICD-10-CM | POA: Diagnosis not present

## 2023-04-04 DIAGNOSIS — I10 Essential (primary) hypertension: Secondary | ICD-10-CM | POA: Diagnosis not present

## 2023-04-04 DIAGNOSIS — Z9181 History of falling: Secondary | ICD-10-CM | POA: Diagnosis not present

## 2023-04-04 DIAGNOSIS — E871 Hypo-osmolality and hyponatremia: Secondary | ICD-10-CM | POA: Diagnosis not present

## 2023-04-04 DIAGNOSIS — Z7985 Long-term (current) use of injectable non-insulin antidiabetic drugs: Secondary | ICD-10-CM | POA: Diagnosis not present

## 2023-04-04 DIAGNOSIS — E876 Hypokalemia: Secondary | ICD-10-CM | POA: Diagnosis not present

## 2023-04-04 DIAGNOSIS — R6520 Severe sepsis without septic shock: Secondary | ICD-10-CM | POA: Diagnosis not present

## 2023-04-04 DIAGNOSIS — G4733 Obstructive sleep apnea (adult) (pediatric): Secondary | ICD-10-CM | POA: Diagnosis not present

## 2023-04-04 DIAGNOSIS — E785 Hyperlipidemia, unspecified: Secondary | ICD-10-CM | POA: Diagnosis not present

## 2023-04-04 DIAGNOSIS — E66813 Obesity, class 3: Secondary | ICD-10-CM | POA: Diagnosis not present

## 2023-04-04 DIAGNOSIS — G001 Pneumococcal meningitis: Secondary | ICD-10-CM | POA: Diagnosis not present

## 2023-04-04 DIAGNOSIS — Z792 Long term (current) use of antibiotics: Secondary | ICD-10-CM | POA: Diagnosis not present

## 2023-04-18 DIAGNOSIS — R6520 Severe sepsis without septic shock: Secondary | ICD-10-CM | POA: Diagnosis not present

## 2023-04-18 DIAGNOSIS — G929 Unspecified toxic encephalopathy: Secondary | ICD-10-CM | POA: Diagnosis not present

## 2023-04-18 DIAGNOSIS — G4733 Obstructive sleep apnea (adult) (pediatric): Secondary | ICD-10-CM | POA: Diagnosis not present

## 2023-04-18 DIAGNOSIS — E66813 Obesity, class 3: Secondary | ICD-10-CM | POA: Diagnosis not present

## 2023-04-18 DIAGNOSIS — Z6841 Body Mass Index (BMI) 40.0 and over, adult: Secondary | ICD-10-CM | POA: Diagnosis not present

## 2023-04-18 DIAGNOSIS — G001 Pneumococcal meningitis: Secondary | ICD-10-CM | POA: Diagnosis not present

## 2023-04-18 DIAGNOSIS — E876 Hypokalemia: Secondary | ICD-10-CM | POA: Diagnosis not present

## 2023-04-18 DIAGNOSIS — Z9181 History of falling: Secondary | ICD-10-CM | POA: Diagnosis not present

## 2023-04-18 DIAGNOSIS — E871 Hypo-osmolality and hyponatremia: Secondary | ICD-10-CM | POA: Diagnosis not present

## 2023-04-18 DIAGNOSIS — Z452 Encounter for adjustment and management of vascular access device: Secondary | ICD-10-CM | POA: Diagnosis not present

## 2023-04-18 DIAGNOSIS — Z7985 Long-term (current) use of injectable non-insulin antidiabetic drugs: Secondary | ICD-10-CM | POA: Diagnosis not present

## 2023-04-18 DIAGNOSIS — I10 Essential (primary) hypertension: Secondary | ICD-10-CM | POA: Diagnosis not present

## 2023-04-18 DIAGNOSIS — E785 Hyperlipidemia, unspecified: Secondary | ICD-10-CM | POA: Diagnosis not present

## 2023-04-18 DIAGNOSIS — E119 Type 2 diabetes mellitus without complications: Secondary | ICD-10-CM | POA: Diagnosis not present

## 2023-04-18 DIAGNOSIS — Z792 Long term (current) use of antibiotics: Secondary | ICD-10-CM | POA: Diagnosis not present

## 2023-04-24 DIAGNOSIS — G4733 Obstructive sleep apnea (adult) (pediatric): Secondary | ICD-10-CM | POA: Diagnosis not present

## 2023-08-18 ENCOUNTER — Telehealth: Payer: Self-pay

## 2023-08-18 NOTE — Telephone Encounter (Signed)
 Returning phone call from Pt ( see last telephone encounter) to let him know the reason for needed [place] is routine for his upcoming office visit

## 2023-08-18 NOTE — Telephone Encounter (Signed)
 Patient needing to know why he is needing to have an ultra sound done.  Please advise.  Call:  [phone]

## 2023-08-22 ENCOUNTER — Ambulatory Visit (HOSPITAL_COMMUNITY)

## 2023-09-10 DIAGNOSIS — G4733 Obstructive sleep apnea (adult) (pediatric): Secondary | ICD-10-CM | POA: Diagnosis not present

## 2023-10-10 DIAGNOSIS — G4733 Obstructive sleep apnea (adult) (pediatric): Secondary | ICD-10-CM | POA: Diagnosis not present

## 2023-11-10 DIAGNOSIS — G4733 Obstructive sleep apnea (adult) (pediatric): Secondary | ICD-10-CM | POA: Diagnosis not present

## 2023-11-21 ENCOUNTER — Ambulatory Visit (HOSPITAL_COMMUNITY): Payer: Self-pay

## 2023-11-27 ENCOUNTER — Ambulatory Visit (HOSPITAL_COMMUNITY)
Admission: RE | Admit: 2023-11-27 | Discharge: 2023-11-27 | Disposition: A | Discharge status: Home / Self Care | Payer: Self-pay | Source: Ambulatory Visit | Attending: Urology | Admitting: Urology

## 2023-11-27 DIAGNOSIS — Z87442 Personal history of urinary calculi: Secondary | ICD-10-CM | POA: Diagnosis not present

## 2023-11-27 DIAGNOSIS — Z0389 Encounter for observation for other suspected diseases and conditions ruled out: Secondary | ICD-10-CM | POA: Diagnosis not present

## 2023-11-28 ENCOUNTER — Ambulatory Visit: Admitting: Urology

## 2023-11-28 ENCOUNTER — Encounter: Payer: Self-pay | Admitting: Urology

## 2023-11-28 ENCOUNTER — Other Ambulatory Visit: Payer: Self-pay

## 2023-11-28 VITALS — BP 153/91 | HR 79

## 2023-11-28 DIAGNOSIS — Z125 Encounter for screening for malignant neoplasm of prostate: Secondary | ICD-10-CM | POA: Diagnosis not present

## 2023-11-28 DIAGNOSIS — N138 Other obstructive and reflux uropathy: Secondary | ICD-10-CM

## 2023-11-28 DIAGNOSIS — Z87442 Personal history of urinary calculi: Secondary | ICD-10-CM

## 2023-11-28 DIAGNOSIS — R351 Nocturia: Secondary | ICD-10-CM

## 2023-11-28 DIAGNOSIS — N401 Enlarged prostate with lower urinary tract symptoms: Secondary | ICD-10-CM

## 2023-11-28 LAB — URINALYSIS, ROUTINE W REFLEX MICROSCOPIC
Bilirubin, UA: NEGATIVE
Glucose, UA: NEGATIVE
Ketones, UA: NEGATIVE
Leukocytes,UA: NEGATIVE
Nitrite, UA: NEGATIVE
Protein,UA: NEGATIVE
RBC, UA: NEGATIVE
Specific Gravity, UA: 1.03 (ref 1.005–1.030)
Urobilinogen, Ur: 0.2 mg/dL (ref 0.2–1.0)
pH, UA: 6 (ref 5.0–7.5)

## 2023-11-28 MED ORDER — ALFUZOSIN HCL ER 10 MG PO TB24: 10.0000 mg | ORAL_TABLET | Freq: Every day | ORAL | 11 refills | Status: AC
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
## 2023-11-28 NOTE — Patient Instructions (Signed)

## 2023-11-29 LAB — PSA: Prostate Specific Ag, Serum: 0.7 ng/mL (ref 0.0–4.0)

## 2023-12-01 DIAGNOSIS — E782 Mixed hyperlipidemia: Secondary | ICD-10-CM | POA: Diagnosis not present

## 2023-12-01 DIAGNOSIS — E1169 Type 2 diabetes mellitus with other specified complication: Secondary | ICD-10-CM | POA: Diagnosis not present

## 2023-12-01 DIAGNOSIS — I10 Essential (primary) hypertension: Secondary | ICD-10-CM | POA: Diagnosis not present

## 2023-12-02 ENCOUNTER — Ambulatory Visit: Payer: Self-pay | Admitting: Urology

## 2023-12-10 DIAGNOSIS — Z6841 Body Mass Index (BMI) 40.0 and over, adult: Secondary | ICD-10-CM | POA: Diagnosis not present

## 2023-12-10 DIAGNOSIS — H6690 Otitis media, unspecified, unspecified ear: Secondary | ICD-10-CM | POA: Diagnosis not present

## 2023-12-10 DIAGNOSIS — G4733 Obstructive sleep apnea (adult) (pediatric): Secondary | ICD-10-CM | POA: Diagnosis not present

## 2024-01-10 DIAGNOSIS — G4733 Obstructive sleep apnea (adult) (pediatric): Secondary | ICD-10-CM | POA: Diagnosis not present

## 2024-01-26 DIAGNOSIS — R030 Elevated blood-pressure reading, without diagnosis of hypertension: Secondary | ICD-10-CM | POA: Diagnosis not present

## 2024-01-26 DIAGNOSIS — H60331 Swimmer's ear, right ear: Secondary | ICD-10-CM | POA: Diagnosis not present

## 2024-01-26 DIAGNOSIS — Z6841 Body Mass Index (BMI) 40.0 and over, adult: Secondary | ICD-10-CM | POA: Diagnosis not present

## 2024-02-12 DIAGNOSIS — E782 Mixed hyperlipidemia: Secondary | ICD-10-CM | POA: Diagnosis not present

## 2024-02-12 DIAGNOSIS — I10 Essential (primary) hypertension: Secondary | ICD-10-CM | POA: Diagnosis not present

## 2024-02-12 DIAGNOSIS — Z79899 Other long term (current) drug therapy: Secondary | ICD-10-CM | POA: Diagnosis not present

## 2024-02-12 DIAGNOSIS — F322 Major depressive disorder, single episode, severe without psychotic features: Secondary | ICD-10-CM | POA: Diagnosis not present

## 2024-02-18 DIAGNOSIS — H6691 Otitis media, unspecified, right ear: Secondary | ICD-10-CM | POA: Diagnosis not present

## 2024-02-20 ENCOUNTER — Encounter (INDEPENDENT_AMBULATORY_CARE_PROVIDER_SITE_OTHER): Payer: Self-pay

## 2024-03-03 ENCOUNTER — Encounter (INDEPENDENT_AMBULATORY_CARE_PROVIDER_SITE_OTHER): Payer: Self-pay | Admitting: Otolaryngology

## 2024-03-03 ENCOUNTER — Ambulatory Visit (INDEPENDENT_AMBULATORY_CARE_PROVIDER_SITE_OTHER): Admitting: Otolaryngology

## 2024-03-03 VITALS — BP 148/101 | HR 85 | Temp 97.7°F | Ht 67.0 in | Wt 275.0 lb

## 2024-03-03 DIAGNOSIS — M26629 Arthralgia of temporomandibular joint, unspecified side: Secondary | ICD-10-CM | POA: Diagnosis not present

## 2024-03-03 DIAGNOSIS — H6991 Unspecified Eustachian tube disorder, right ear: Secondary | ICD-10-CM | POA: Diagnosis not present

## 2024-03-03 DIAGNOSIS — M26621 Arthralgia of right temporomandibular joint: Secondary | ICD-10-CM

## 2024-03-03 DIAGNOSIS — H9201 Otalgia, right ear: Secondary | ICD-10-CM

## 2024-03-03 MED ORDER — LEVOCETIRIZINE DIHYDROCHLORIDE 5 MG PO TABS: 5.0000 mg | ORAL_TABLET | Freq: Every evening | ORAL | 3 refills | Status: AC

## 2024-03-03 MED ORDER — FLUTICASONE PROPIONATE 50 MCG/ACT NA SUSP: 2.0000 | Freq: Two times a day (BID) | NASAL | 6 refills | Status: AC
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
## 2024-03-03 NOTE — Patient Instructions (Signed)

## 2024-05-26 DIAGNOSIS — I10 Essential (primary) hypertension: Secondary | ICD-10-CM | POA: Diagnosis not present

## 2024-05-26 DIAGNOSIS — E1169 Type 2 diabetes mellitus with other specified complication: Secondary | ICD-10-CM | POA: Diagnosis not present

## 2024-05-26 DIAGNOSIS — E782 Mixed hyperlipidemia: Secondary | ICD-10-CM | POA: Diagnosis not present

## 2024-11-12 ENCOUNTER — Other Ambulatory Visit (HOSPITAL_COMMUNITY)

## 2024-11-26 ENCOUNTER — Ambulatory Visit: Admitting: Urology
